# Patient Record
Sex: Female | Born: 1975 | Race: White | Hispanic: No | Marital: Married | Smoking: Current every day smoker
Health system: Southern US, Community
[De-identification: ages and names within clinical notes are randomized; demographics above are authoritative.]

## PROBLEM LIST (undated history)

## (undated) DIAGNOSIS — A0472 Enterocolitis due to Clostridium difficile, not specified as recurrent: Secondary | ICD-10-CM

## (undated) DIAGNOSIS — I10 Essential (primary) hypertension: Secondary | ICD-10-CM

## (undated) DIAGNOSIS — N83209 Unspecified ovarian cyst, unspecified side: Secondary | ICD-10-CM

## (undated) DIAGNOSIS — F32A Depression, unspecified: Secondary | ICD-10-CM

## (undated) DIAGNOSIS — F419 Anxiety disorder, unspecified: Secondary | ICD-10-CM

## (undated) DIAGNOSIS — F329 Major depressive disorder, single episode, unspecified: Secondary | ICD-10-CM

## (undated) DIAGNOSIS — N809 Endometriosis, unspecified: Secondary | ICD-10-CM

## (undated) HISTORY — PX: OVARIAN CYST REMOVAL: SHX89

---

## 1998-08-27 ENCOUNTER — Other Ambulatory Visit: Admission: RE | Admit: 1998-08-27 | Discharge: 1998-08-27 | Payer: Self-pay | Admitting: Obstetrics & Gynecology

## 1998-09-30 ENCOUNTER — Ambulatory Visit (HOSPITAL_COMMUNITY): Admission: RE | Admit: 1998-09-30 | Discharge: 1998-09-30 | Payer: Self-pay | Admitting: Obstetrics & Gynecology

## 1998-11-11 ENCOUNTER — Encounter: Payer: Self-pay | Admitting: Emergency Medicine

## 1998-11-11 ENCOUNTER — Emergency Department (HOSPITAL_COMMUNITY): Admission: EM | Admit: 1998-11-11 | Discharge: 1998-11-11 | Payer: Self-pay | Admitting: Emergency Medicine

## 2000-06-07 ENCOUNTER — Other Ambulatory Visit: Admission: RE | Admit: 2000-06-07 | Discharge: 2000-06-07 | Payer: Self-pay | Admitting: Obstetrics and Gynecology

## 2001-05-18 ENCOUNTER — Other Ambulatory Visit: Admission: RE | Admit: 2001-05-18 | Discharge: 2001-05-18 | Payer: Self-pay | Admitting: Obstetrics and Gynecology

## 2001-06-29 ENCOUNTER — Encounter: Payer: Self-pay | Admitting: Obstetrics and Gynecology

## 2001-06-29 ENCOUNTER — Ambulatory Visit (HOSPITAL_COMMUNITY): Admission: RE | Admit: 2001-06-29 | Discharge: 2001-06-29 | Payer: Self-pay | Admitting: Obstetrics and Gynecology

## 2001-11-22 ENCOUNTER — Inpatient Hospital Stay (HOSPITAL_COMMUNITY): Admission: AD | Admit: 2001-11-22 | Discharge: 2001-11-24 | Payer: Self-pay | Admitting: Obstetrics and Gynecology

## 2006-11-21 ENCOUNTER — Emergency Department (HOSPITAL_COMMUNITY): Admission: EM | Admit: 2006-11-21 | Discharge: 2006-11-21 | Payer: Self-pay | Admitting: Emergency Medicine

## 2007-10-28 ENCOUNTER — Inpatient Hospital Stay (HOSPITAL_COMMUNITY): Admission: AD | Admit: 2007-10-28 | Discharge: 2007-10-28 | Payer: Self-pay | Admitting: Obstetrics & Gynecology

## 2007-10-30 ENCOUNTER — Inpatient Hospital Stay (HOSPITAL_COMMUNITY): Admission: AD | Admit: 2007-10-30 | Discharge: 2007-10-30 | Payer: Self-pay | Admitting: Obstetrics and Gynecology

## 2007-11-06 ENCOUNTER — Encounter: Payer: Self-pay | Admitting: Obstetrics & Gynecology

## 2007-11-06 ENCOUNTER — Ambulatory Visit: Payer: Self-pay | Admitting: Obstetrics & Gynecology

## 2007-11-06 ENCOUNTER — Ambulatory Visit (HOSPITAL_COMMUNITY): Admission: AD | Admit: 2007-11-06 | Discharge: 2007-11-06 | Payer: Self-pay | Admitting: Obstetrics & Gynecology

## 2007-11-08 ENCOUNTER — Inpatient Hospital Stay (HOSPITAL_COMMUNITY): Admission: AD | Admit: 2007-11-08 | Discharge: 2007-11-08 | Payer: Self-pay | Admitting: Obstetrics & Gynecology

## 2007-11-10 ENCOUNTER — Inpatient Hospital Stay (HOSPITAL_COMMUNITY): Admission: AD | Admit: 2007-11-10 | Discharge: 2007-11-10 | Payer: Self-pay | Admitting: Obstetrics and Gynecology

## 2008-08-21 ENCOUNTER — Emergency Department (HOSPITAL_COMMUNITY): Admission: EM | Admit: 2008-08-21 | Discharge: 2008-08-21 | Payer: Self-pay | Admitting: Emergency Medicine

## 2009-09-18 ENCOUNTER — Emergency Department (HOSPITAL_COMMUNITY): Admission: EM | Admit: 2009-09-18 | Discharge: 2009-09-18 | Payer: Self-pay | Admitting: Emergency Medicine

## 2011-03-12 LAB — URINALYSIS, ROUTINE W REFLEX MICROSCOPIC
Glucose, UA: NEGATIVE mg/dL
Nitrite: NEGATIVE
Specific Gravity, Urine: 1.019 (ref 1.005–1.030)
pH: 6 (ref 5.0–8.0)

## 2011-03-12 LAB — URINE CULTURE: Colony Count: 40000

## 2011-03-12 LAB — URINE MICROSCOPIC-ADD ON

## 2011-03-24 ENCOUNTER — Emergency Department (HOSPITAL_COMMUNITY): Payer: Self-pay

## 2011-03-24 ENCOUNTER — Emergency Department (HOSPITAL_COMMUNITY)
Admission: EM | Admit: 2011-03-24 | Discharge: 2011-03-25 | Disposition: A | Discharge status: Other Acute Inpatient Hospital | Payer: Self-pay | Source: Home / Self Care | Attending: Emergency Medicine | Admitting: Emergency Medicine

## 2011-03-24 LAB — DIFFERENTIAL
Eosinophils Relative: 0 % (ref 0–5)
Lymphocytes Relative: 18 % (ref 12–46)
Lymphs Abs: 3.7 10*3/uL (ref 0.7–4.0)
Monocytes Relative: 7 % (ref 3–12)

## 2011-03-24 LAB — CBC
HCT: 40.9 % (ref 36.0–46.0)
MCH: 32.9 pg (ref 26.0–34.0)
MCV: 98.3 fL (ref 78.0–100.0)
RBC: 4.16 MIL/uL (ref 3.87–5.11)
RDW: 12.9 % (ref 11.5–15.5)
WBC: 20.4 10*3/uL — ABNORMAL HIGH (ref 4.0–10.5)

## 2011-03-24 LAB — COMPREHENSIVE METABOLIC PANEL
Alkaline Phosphatase: 71 U/L (ref 39–117)
BUN: 12 mg/dL (ref 6–23)
Chloride: 102 mEq/L (ref 96–112)
Creatinine, Ser: 0.7 mg/dL (ref 0.4–1.2)
Glucose, Bld: 111 mg/dL — ABNORMAL HIGH (ref 70–99)
Potassium: 3.6 mEq/L (ref 3.5–5.1)
Total Bilirubin: 0.6 mg/dL (ref 0.3–1.2)

## 2011-03-24 LAB — LIPASE, BLOOD: Lipase: 20 U/L (ref 11–59)

## 2011-03-24 LAB — POCT PREGNANCY, URINE: Preg Test, Ur: NEGATIVE

## 2011-03-24 LAB — URINALYSIS, ROUTINE W REFLEX MICROSCOPIC
Leukocytes, UA: NEGATIVE
Nitrite: NEGATIVE
Protein, ur: NEGATIVE mg/dL
Urobilinogen, UA: 1 mg/dL (ref 0.0–1.0)

## 2011-03-24 LAB — URINE MICROSCOPIC-ADD ON

## 2011-03-25 ENCOUNTER — Encounter (HOSPITAL_COMMUNITY): Payer: Self-pay

## 2011-03-25 ENCOUNTER — Emergency Department (HOSPITAL_COMMUNITY): Payer: Self-pay

## 2011-03-25 ENCOUNTER — Inpatient Hospital Stay (HOSPITAL_COMMUNITY)
Admission: AD | Admit: 2011-03-25 | Discharge: 2011-03-27 | DRG: 392 | Disposition: A | Discharge status: Home / Self Care | Payer: Self-pay | Source: Ambulatory Visit | Attending: Obstetrics & Gynecology | Admitting: Obstetrics & Gynecology

## 2011-03-25 DIAGNOSIS — R1900 Intra-abdominal and pelvic swelling, mass and lump, unspecified site: Principal | ICD-10-CM | POA: Diagnosis present

## 2011-03-25 DIAGNOSIS — R109 Unspecified abdominal pain: Secondary | ICD-10-CM | POA: Diagnosis present

## 2011-03-25 LAB — DIFFERENTIAL
Eosinophils Relative: 1 % (ref 0–5)
Lymphocytes Relative: 20 % (ref 12–46)
Lymphs Abs: 2.8 10*3/uL (ref 0.7–4.0)
Monocytes Absolute: 1.1 10*3/uL — ABNORMAL HIGH (ref 0.1–1.0)
Monocytes Relative: 8 % (ref 3–12)

## 2011-03-25 LAB — CBC
HCT: 38.3 % (ref 36.0–46.0)
MCV: 100.3 fL — ABNORMAL HIGH (ref 78.0–100.0)
RDW: 13.2 % (ref 11.5–15.5)
WBC: 13.9 10*3/uL — ABNORMAL HIGH (ref 4.0–10.5)

## 2011-03-25 LAB — CA 125: CA 125: 51.9 U/mL — ABNORMAL HIGH (ref 0.0–30.2)

## 2011-03-25 MED ORDER — IOHEXOL 300 MG/ML  SOLN
100.0000 mL | Freq: Once | INTRAMUSCULAR | Status: AC | PRN
  Administered 2011-03-25: 100 mL via INTRAVENOUS

## 2011-03-26 LAB — CBC
HCT: 37.1 % (ref 36.0–46.0)
MCV: 100.5 fL — ABNORMAL HIGH (ref 78.0–100.0)
RBC: 3.69 MIL/uL — ABNORMAL LOW (ref 3.87–5.11)
RDW: 13.1 % (ref 11.5–15.5)
WBC: 9.5 10*3/uL (ref 4.0–10.5)

## 2011-03-26 LAB — DIFFERENTIAL
Eosinophils Relative: 2 % (ref 0–5)
Lymphocytes Relative: 25 % (ref 12–46)
Lymphs Abs: 2.4 10*3/uL (ref 0.7–4.0)

## 2011-03-28 LAB — BETA HCG QUANT (REF LAB): Beta hCG, Tumor Marker: <0.5 m[IU]/mL (ref <5.0)

## 2011-03-30 ENCOUNTER — Inpatient Hospital Stay (HOSPITAL_COMMUNITY)
Admission: AD | Admit: 2011-03-30 | Discharge: 2011-03-30 | Disposition: A | Discharge status: Home / Self Care | Payer: Self-pay | Source: Ambulatory Visit | Attending: Obstetrics & Gynecology | Admitting: Obstetrics & Gynecology

## 2011-03-30 ENCOUNTER — Inpatient Hospital Stay (HOSPITAL_COMMUNITY): Payer: Self-pay

## 2011-03-30 DIAGNOSIS — R1031 Right lower quadrant pain: Secondary | ICD-10-CM

## 2011-03-30 DIAGNOSIS — N83209 Unspecified ovarian cyst, unspecified side: Secondary | ICD-10-CM

## 2011-03-30 LAB — DIFFERENTIAL
Eosinophils Relative: 1 % (ref 0–5)
Lymphocytes Relative: 24 % (ref 12–46)
Lymphs Abs: 2.6 10*3/uL (ref 0.7–4.0)
Neutro Abs: 6.9 10*3/uL (ref 1.7–7.7)
Neutrophils Relative %: 64 % (ref 43–77)

## 2011-03-30 LAB — URINALYSIS, ROUTINE W REFLEX MICROSCOPIC
Bilirubin Urine: NEGATIVE
Glucose, UA: NEGATIVE mg/dL
Hgb urine dipstick: NEGATIVE
Specific Gravity, Urine: 1.015 (ref 1.005–1.030)
pH: 6 (ref 5.0–8.0)

## 2011-03-30 LAB — CBC
Hemoglobin: 13.3 g/dL (ref 12.0–15.0)
MCH: 32.8 pg (ref 26.0–34.0)
MCHC: 33.3 g/dL (ref 30.0–36.0)
Platelets: 328 10*3/uL (ref 150–400)

## 2011-03-30 LAB — COMPREHENSIVE METABOLIC PANEL
AST: 25 U/L (ref 0–37)
CO2: 25 mEq/L (ref 19–32)
Calcium: 8.7 mg/dL (ref 8.4–10.5)
Creatinine, Ser: 0.6 mg/dL (ref 0.4–1.2)
GFR calc Af Amer: >60 mL/min (ref >60)
GFR calc non Af Amer: >60 mL/min (ref >60)

## 2011-03-30 LAB — URINE MICROSCOPIC-ADD ON

## 2011-03-30 MED ORDER — IOHEXOL 300 MG/ML  SOLN
100.0000 mL | Freq: Once | INTRAMUSCULAR | Status: AC | PRN
  Administered 2011-03-30: 100 mL via INTRAVENOUS

## 2011-04-01 NOTE — Discharge Summary (Signed)
  Katherine, [name]                ACCOUNT NO.:  [id number]  MEDICAL RECORD NO.:  [id number]           PATIENT TYPE:  I  LOCATION:  9311                          [facility]  PHYSICIAN:  Catalina Antigua, MD     DATE OF BIRTH:  1976/11/01  DATE OF ADMISSION:  03/25/2011 DATE OF DISCHARGE:  03/27/2011                              DISCHARGE SUMMARY   ADMISSION DIAGNOSIS:  Abdominal pain with pelvic mass.  DISCHARGE DIAGNOSIS:  Pelvic mass.  HOSPITAL COURSE:  This is a 35 year old G3, P1-0-2-1 who presented to [facility] for evaluation of abdominal pain.  Ultrasound and CT obtained at that time revealed a 9.6 x 7.1 x 8.2 cm solid-appearing mass to the uterus, which appears to extend to her right ovary with vascularity, small amount of free fluid was visualized in the pelvis. Mild inflammation at the base of the appendix.  The patient at that point reported a 3-day history of [name] pain.  The patient's care was transferred from Dr. Ewell Poe service to faculty service on March 25, 2011.  The patient's vital signs at that time were stable with a white count of admission of 20.4.  The patient was admitted for management of possible TOA versus ovarian fibroma versus uterine fibroids, no neoplasm.  Throughout her hospital course, the patient remained afebrile.  She was started on a course of cefotetan and doxycycline.  On hospital day #2, her white count was found to be 9.6.  The patient tolerated a regular diet and was able to ambulate and void and reported significant improvement in her abdominal pain.  CA-125 obtained was found to be 49.  On hospital day #2, the patient was deemed to be stable for discharge.  The patient was provided with a prescription of doxycycline 100 mg p.o. b.i.d. for 14 days and Flagyl 500 mg p.o. b.i.d. for 14 days.  The patient was instructed to return to the [facility] if she develops fevers or worsening abdominal pain.  The patient also instructed to follow  up in 2-3 weeks with GYN Clinic, specifically with Dr. Marice Potter for possible surgical intervention regarding this pelvic mass. The patient verbalized understanding, all questions were answered.     Catalina Antigua, MD     PC/MEDQ  D:  03/27/2011  T:  03/28/2011  Job:  [id number]  Electronically Signed by Catalina Antigua  on 04/01/2011 05:06:10 PM

## 2011-04-08 ENCOUNTER — Other Ambulatory Visit: Payer: Self-pay | Admitting: Obstetrics & Gynecology

## 2011-04-08 ENCOUNTER — Encounter (INDEPENDENT_AMBULATORY_CARE_PROVIDER_SITE_OTHER): Payer: Self-pay | Admitting: Obstetrics & Gynecology

## 2011-04-08 DIAGNOSIS — N83209 Unspecified ovarian cyst, unspecified side: Secondary | ICD-10-CM

## 2011-04-09 NOTE — Group Therapy Note (Signed)
[name], [name] NO.:  [id number]  MEDICAL RECORD NO.:  [id number]           PATIENT TYPE:  A  LOCATION:  [facility]                   FACILITY:  [facility]  PHYSICIAN:  Elsie Lincoln, MD      DATE OF BIRTH:  March 12, 1976  DATE OF SERVICE:  04/08/2011                                 CLINIC NOTE  The patient is a 35 year old female who presents for followup of a right adnexal mass and pain.  The patient was scanned with a CT scan and an ultrasound, which showed a 9.5 x 6.6 x 8.1 cm mass in the anterior right pelvic that appears to rise in the right ovary.  There is solid and cystic components and increased vascularity.  The patient had a smaller cyst about 2 cm size in 2008, but never had followup.  The patient is having pain and taking Dilaudid, this is keeping the pain better than the Percocet.  The patient denies any family history of ovarian or breast cancer.  PAST MEDICAL HISTORY:  No reported medical problem.  PAST SURGICAL HISTORY:  D and C.  SOCIAL HISTORY:  Has smoked in the past 12 months, occasional drinker, and no current drug abuse.  MEDICATIONS:  Doxycycline, Flagyl, hydrochlorothiazide, Dilaudid.  ALLERGIES:  None.  GYNECOLOGIC HISTORY: 1. Vaginal delivery. 2. Miscarriages. 3. No history of abnormal Pap smears. 4. Positive history of ovarian cyst as described above. 5. No fibroids. 6. No sexually transmitted diseases.  PHYSICAL EXAMINATION:  VITAL SIGNS:  Temperature 97, pulse 97.4, pulse 111, blood pressure 136/100, weight 139, height 64 inches. GENERAL:  Well nourished, well developed, in no apparent distress. HEENT:  Normocephalic, atraumatic.  Poor dentition. LUNGS:  Clear to auscultation bilaterally. HEART:  Regular rate and rhythm. BREASTS:  No masses, nontender.  No lymphadenopathy. ABDOMEN:  Soft.  No rebound or guarding. GENITALIA:  Tanner V.  Vagina pink.  Normal rugae.  Cervix closed, nontender.  Uterus anteverted, nontender.   Left adnexa nontender, normal size.  Right adnexa, large mass and painful on palpation. EXTREMITIES:  No edema.  ASSESSMENT AND PLAN: 63. A 35 year old female with right-sided ovarian/adnexal masses that     is worrisome for neoplasm.  We will refer to Gynecologic Oncology 2. CA-125 was drawn, which was slightly elevated, approximately 51. 3. Pap smear drawn. 4. Prescription for Dilaudid given. 5. The patient to come back here in 2-3 weeks after Gynecologic     Oncology appointment          ______________________________ Elsie Lincoln, MD    KL/MEDQ  D:  04/08/2011  T:  04/09/2011  Job:  [id number]

## 2011-04-21 NOTE — Op Note (Signed)
NAME:  MARC, SIVERTSEN NO.:  [id number]   MEDICAL RECORD NO.:  [id number]          PATIENT TYPE:  MAT   LOCATION:  MATC                          [facility]   PHYSICIAN:  Lesly Dukes, M.D. DATE OF BIRTH:  [birth year]   DATE OF PROCEDURE:  DATE OF DISCHARGE:                               OPERATIVE REPORT   PREOPERATIVE DIAGNOSIS:  A 35 year old female with inappropriate lapse  in betas, with a questionable missed abortion versus ectopic pregnancy  based on ultrasound findings.   POSTOPERATIVE DIAGNOSIS:  A 35 year old female with inappropriate lapse  in betas, with a questionable missed abortion versus ectopic pregnancy  based on ultrasound findings.   PROCEDURE:  Dilation evacuation with a frozen sectional pathology which  is not pending at the time of this dictation.   SURGEON:  Lesly Dukes, M.D.   ANESTHESIA:  MAC and local.   SPECIMENS:  Endometrial curettings to pathology for frozen section.   ESTIMATED BLOOD LOSS:  Minimal.   COMPLICATIONS:  None.   FINDINGS:  A slightly enlarged, anteverted uterus with no adnexal masses  on bimanual exam.  However, ultrasound shows a questionable gestational  sac versus pseudo sac in the endometrium, and a right adnexal mass that  could either be ectopic pregnancy or pedunculated bipartite.   PROCEDURE IN DETAIL:  After informed consent was obtained, the patient  was taken to the operating room, where MAC anesthesia was induced.  The  patient was placed in dorsal lithotomy position and prepared and draped  in the normal, sterile fashion.  The bladder was emptied.  A bivalve  speculum was placed into the vagina, and the anterior lip of the cervix  was grasped with a single-tooth tenaculum.  Twenty mL of 1% lidocaine  were injected at 3:00 and 9:00 for anesthesia.  The cervical os was  gently dilated to a #9 Hegar, and a #8 curved suction curet was gently  introduced into the uterus, and gentle suction  curettage was performed.  The suction curet was removed, and gentle, sharp curettage was  performed.  One final pass with the suction curet was performed  to  ensure that all  products of conception were removed.  At the end of the procedure, all  instruments were removed from the patient's vagina, and there was good  hemostasis from the cervix.  Again we were waiting on the results from  the pathologist to see if there was villi.  If there are no villi  present, the patient will proceed with methotrexate.      Lesly Dukes, M.D.  Electronically Signed     KHL/MEDQ  D:  11/06/2007  T:  11/06/2007  Job:  [id number]

## 2011-04-22 ENCOUNTER — Encounter: Payer: Self-pay | Admitting: Obstetrics & Gynecology

## 2011-04-22 ENCOUNTER — Ambulatory Visit: Payer: Self-pay | Attending: Gynecologic Oncology | Admitting: Gynecologic Oncology

## 2011-04-22 DIAGNOSIS — F17200 Nicotine dependence, unspecified, uncomplicated: Secondary | ICD-10-CM | POA: Insufficient documentation

## 2011-04-22 DIAGNOSIS — I10 Essential (primary) hypertension: Secondary | ICD-10-CM | POA: Insufficient documentation

## 2011-04-22 DIAGNOSIS — N839 Noninflammatory disorder of ovary, fallopian tube and broad ligament, unspecified: Secondary | ICD-10-CM | POA: Insufficient documentation

## 2011-04-22 DIAGNOSIS — Z801 Family history of malignant neoplasm of trachea, bronchus and lung: Secondary | ICD-10-CM | POA: Insufficient documentation

## 2011-04-23 NOTE — Consult Note (Signed)
NAMEKRISTLE, [name]                ACCOUNT NO.:  [id number]  MEDICAL RECORD NO.:  [id number]           PATIENT TYPE:  O  LOCATION:  GYN                          FACILITY:  [facility]  PHYSICIAN:  Avin Upperman A. Duard Brady, MD    DATE OF BIRTH:  12/24/1975  DATE OF CONSULTATION:  04/22/2011 DATE OF DISCHARGE:                                CONSULTATION   REFERRING PHYSICIAN:  Lesly Dukes, MD  The patient is seen today in consultation at the request of Dr. Penne Lash.  [name] is a 35 year old gravida 3, para 1 whose cycle is on today. She was in her usual state of health until April 14th when her cycle started, she began having increased cramping and discomfort.  On April 17th, she had increased bleeding, pain, and bloating and went to her local emergency room.  At that time, she had an ultrasound and CT scan done in the emergency room.  The CT revealed a 9.6 x 7.1 x 8.2 cm solid- appearing mass to the right of the uterus.  When compared to an ultrasound she had in December 2008, it appeared to be in a similar location and that she had a solid mass, but it is enlarging in comparison to the ultrasound at that time.  On CT scan, the mass measured as above, it was anterior on the right pelvis, appeared to be arising from the right ovary.  It was a solid appearance.  There was no colonic diverticulitis.  The term ileum was normal.  There was no evidence for appendicitis.  There was no lymphadenopathy reported or noted.  On ultrasound, she had uterus, measured 8.6 x 4.7 x 6 cm.  On the right ovary,  there was a 6.7 x 6.7 x 9.2 cm solid-appearing mass. The uterus extended to the right ovary and felt to be arise from the right ovary.  The solid mass was demonstrated in the similar location as stated above in December 2008, but is enlarged since that time.  Flow was demonstrated within the lesion on color Doppler.  Arterial velocity wave form was obtained.  Therefore, there was no torsion.  The  left ovary contained a simple-appearing cyst.  There was a moderate amount of free fluid.  The CA-125 was obtained that was elevated at 51.  At the time of her presentation, her white count was elevated to 20.  She was admitted for questionable TOA versus fibroma versus fibroid versus a neoplasm.  She remained afebrile, was started on cefotetan and doxycycline.  She quickly had a normal white blood count and on hospital day #2 was discharged with followup with [place].  She states since that time, she has been taking Dilaudid 2 mg about 3 times a day, her pain is about 2-3.  She has had increased pain this week as she has been on her menstrual cycle and started taking 3-4 Dilaudid per day.  She is using a cane to walk which helps with the pressure.  She denies any change in bowel or bladder habits, any nausea, vomiting, any early satiety, or dyspareunia.  Her cycles are regular, but  every 28 days.  Occasionally, she will have some bit lighter or heavier than the other.  MEDICATIONS:  Dilaudid and hydrochlorothiazide which she was to be taking, but ran out.  ALLERGIES:  None.  PAST SURGICAL HISTORY:  D and C.  She has a 40-year-old son via spontaneous vaginal delivery.  PAST MEDICAL HISTORY: 1. Hypertension. 2. Pelvic mass.  FAMILY HISTORY:  Her father had congestive heart failure and lung cancer.  He was a smoker, also had prostate melanoma.  SOCIAL HISTORY:  She smokes about a pack per day, she has done for 20 years.  She drinks 2 glasses of wine per day.  She is currently not working.  She denies any current drug use.  PHYSICAL EXAMINATION:  VITAL SIGNS:  Height 5 feet 4 inches, weight 145 pounds, blood pressure 150/100, pulse 80, respirations 16, temperature 97.8. GENERAL:  A well-nourished, well-developed female, in no acute distress. HEENT:  She has very poor dentition. NECK:  Supple.  There is no lymphadenopathy, no thyromegaly. LUNGS:  Clear to auscultation  bilaterally. CARDIOVASCULAR:  Regular rate and rhythm. ABDOMEN:  She has a tattoo in the right lower quadrant.  Abdomen is soft, it is tender to deep palpation.  No rebound.  No guarding.  There are no masses felt on abdominal exam.  Groins are negative for adenopathy. EXTREMITIES:  There is a large ecchymosis, measuring approximately 4-5 cm on her left anterior thigh. PELVIC:  External genitalia is within normal limits.  She is on her menstrual cycle.  Bimanual examination of the cervix is palpably normal. There is no cervical motion tenderness.  In the area of the right adnexa, there is an 8 cm mass that appears to be solid in the right lower quadrant that is easily mobile and ballottable, it is tender to deep palpation.  There are no masses or nodularity otherwise.  The left side is negative.  ASSESSMENT:  A 35 year old who based on imaging and presentation, I feel most likely had an ovarian torsion that resolved spontaneously.  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  That being said because of her pain and of course that there is no malignancy, I would recommend that we proceed with a right salpingo-oophorectomy.  I discussed with her that we could proceed with a laparoscopic approach and that would either allow [place] to perform the entire procedure laparoscopically versus at least assessing laparoscopically and then making decisions regarding a Pfannenstiel incision versus a vertical midline incision.  The surgery will be planned for next week, on May 22nd.  The patient knows that I will not be here that date, but Dr. Nelly Rout will be doing her surgery. We discussed that if the masses will be sent for frozen section, if it is benign that I will conclude the procedure and hopefully this can be done laparoscopically and she can go home.  She also understands that this reveals a malignant process.  She would  like to keep her uterus and her contralateral ovary and that she will need appropriate staging and this would require a laparotomy either via Pfannenstiel or vertical midline incision which she accepts.  Risks and benefits of the procedure were discussed with the patient and she wishes to proceed.  She will have the opportunity to meet Dr. Nelly Rout the morning of surgery. 1. She was given refills on her Dilaudid 2 mg #60 that should cover     her  for her perioperative period as well, particularly if this is     done via laparoscopy. 2. She was given a prescription for hydrochlorothiazide 25 mg daily to     take with refills for her hypertension. 3. She was encouraged to seek dental attention.  She states she is     working on her insurance so that that will be a feasibility.     Tanylah Schnoebelen A. Duard Brady, MD     PAG/MEDQ  D:  04/22/2011  T:  04/23/2011  Job:  [id number]  cc:   Lesly Dukes, M.D.  Telford Nab, R.N. [address] [place], [place] [address]  Electronically Signed by Cleda Mccreedy MD on 04/23/2011 03:13:10 PM

## 2011-04-24 ENCOUNTER — Other Ambulatory Visit: Payer: Self-pay | Admitting: Obstetrics & Gynecology

## 2011-04-24 ENCOUNTER — Other Ambulatory Visit: Payer: Self-pay | Admitting: Gynecologic Oncology

## 2011-04-24 ENCOUNTER — Encounter (HOSPITAL_COMMUNITY): Payer: Self-pay

## 2011-04-24 ENCOUNTER — Ambulatory Visit (HOSPITAL_COMMUNITY)
Admission: RE | Admit: 2011-04-24 | Discharge: 2011-04-24 | Disposition: A | Discharge status: Home / Self Care | Payer: Self-pay | Source: Ambulatory Visit | Attending: Obstetrics & Gynecology | Admitting: Obstetrics & Gynecology

## 2011-04-24 DIAGNOSIS — Z01810 Encounter for preprocedural cardiovascular examination: Secondary | ICD-10-CM | POA: Insufficient documentation

## 2011-04-24 DIAGNOSIS — Z01812 Encounter for preprocedural laboratory examination: Secondary | ICD-10-CM | POA: Insufficient documentation

## 2011-04-24 DIAGNOSIS — Z01818 Encounter for other preprocedural examination: Secondary | ICD-10-CM

## 2011-04-24 LAB — COMPREHENSIVE METABOLIC PANEL
ALT: 24 U/L (ref 0–35)
AST: 25 U/L (ref 0–37)
Alkaline Phosphatase: 81 U/L (ref 39–117)
CO2: 27 mEq/L (ref 19–32)
Chloride: 101 mEq/L (ref 96–112)
GFR calc non Af Amer: >60 mL/min (ref >60)
Glucose, Bld: 139 mg/dL — ABNORMAL HIGH (ref 70–99)
Potassium: 4.4 mEq/L (ref 3.5–5.1)
Sodium: 141 mEq/L (ref 135–145)
Total Bilirubin: 0.4 mg/dL (ref 0.3–1.2)

## 2011-04-24 LAB — DIFFERENTIAL
Eosinophils Relative: 0 % (ref 0–5)
Lymphocytes Relative: 14 % (ref 12–46)
Lymphs Abs: 2.1 10*3/uL (ref 0.7–4.0)
Neutro Abs: 12.4 10*3/uL — ABNORMAL HIGH (ref 1.7–7.7)

## 2011-04-24 LAB — CBC
HCT: 45.3 % (ref 36.0–46.0)
Hemoglobin: 15.6 g/dL — ABNORMAL HIGH (ref 12.0–15.0)
MCV: 95.2 fL (ref 78.0–100.0)
RBC: 4.76 MIL/uL (ref 3.87–5.11)
RDW: 12.8 % (ref 11.5–15.5)
WBC: 15.1 10*3/uL — ABNORMAL HIGH (ref 4.0–10.5)

## 2011-04-24 LAB — TYPE AND SCREEN: ABO/RH(D): A POS

## 2011-04-24 NOTE — Discharge Summary (Signed)
[facility]  Patient:    [name], [name] Visit Number: [id number] MRN: [id number]          Service Type: OBS Location: 910A [id number] [id number] Attending Physician:  Oliver Pila Dictated by:   Alvino Chapel, M.D. Admit Date:  11/22/2001 Discharge Date: 11/24/2001                             Discharge Summary  DISCHARGE DIAGNOSES:          1. Term pregnancy at 39 plus weeks.                               2. History of migraines.                               3. History of depression.                               4. Status post normal spontaneous vaginal                                  delivery.  DISCHARGE MEDICATIONS:        1. Percocet 1-2 tablets q.4h. p.r.n.                               2. Motrin 600 mg p.o. q.4h.  DISCHARGE FOLLOW-UP:          The patient is to follow up in six weeks for her routine postpartum examination.  HOSPITAL COURSE:              The patient is a 35 year old G1, P0 who is admitted at 39+ weeks with contractions every five minutes.  She had spontaneous rupture of membranes, of approximately two hours prior to admission.  The patient was uncomfortable at the time of admission.  Her pregnancy was complicated by history of anxiety and depression, controlled with Zoloft and p.r.n. Xanax.  Also the patient had a history of migraine headaches, however, those have been controlled throughout pregnancy.  PRENATAL LABS:                A+ blood type, antibody negative, RPR nonreactive, rubella immune.  Hepatitis B surface antigen negative.  HIV negative.  GC negative.  Chlamydia negative.  GBS negative.  PAST OBSTETRICAL HISTORY:     None.  PAST GYNECOLOGICAL HISTORY:   History of laser conization in 1997.  PAST SURGICAL HISTORY:        Conization only.  PAST MEDICAL HISTORY:         Depression and migraines.  MEDICATIONS:                  1. Zoloft.                               2. Xanax p.r.n.  HOSPITAL COURSE:               On admission patient is afebrile with stable vital signs.  Fetal heart rate was reassuring.  Cervix was completely effaced, 7 cm and 0  station.  She was grossly ruptured.  She received an epidural anesthesia and quickly reached complete dilation, pushed well.  She had normal spontaneous vaginal delivery of a vigorous female infant, over a first-degree perineal laceration.  Apgars were 8 and 9.  Weight 7 pounds 3 ounces.  The placenta was delivered spontaneously.  Vicryl 2-0 was used to repair the first-degree laceration for hemostasis.  There was also a left labial laceration, repaired for hemostasis with several interrupted sutures. Estimated blood loss was 400 cc.  Cervix and rectum were intact.  The patient was admitted for routine postpartum care and did well and on postpartum day #2 she was afebrile with stable vital signs and had normal lochia, and therefore, she was felt stable for discharge home. Dictated by:   Alvino Chapel, M.D. Attending Physician:  Oliver Pila DD:  12/12/01 TD:  12/12/01 Job: [id number]

## 2011-04-28 ENCOUNTER — Other Ambulatory Visit: Payer: Self-pay | Admitting: Gynecologic Oncology

## 2011-04-28 ENCOUNTER — Ambulatory Visit (HOSPITAL_COMMUNITY)
Admission: RE | Admit: 2011-04-28 | Discharge: 2011-04-28 | Disposition: A | Discharge status: Home / Self Care | Payer: Self-pay | Source: Ambulatory Visit | Attending: Obstetrics & Gynecology | Admitting: Obstetrics & Gynecology

## 2011-04-28 DIAGNOSIS — N80109 Endometriosis of ovary, unspecified side, unspecified depth: Secondary | ICD-10-CM | POA: Insufficient documentation

## 2011-04-28 DIAGNOSIS — I10 Essential (primary) hypertension: Secondary | ICD-10-CM | POA: Insufficient documentation

## 2011-04-28 DIAGNOSIS — N801 Endometriosis of ovary: Secondary | ICD-10-CM | POA: Insufficient documentation

## 2011-04-28 DIAGNOSIS — D279 Benign neoplasm of unspecified ovary: Secondary | ICD-10-CM | POA: Insufficient documentation

## 2011-04-28 DIAGNOSIS — Z79899 Other long term (current) drug therapy: Secondary | ICD-10-CM | POA: Insufficient documentation

## 2011-04-28 DIAGNOSIS — N8353 Torsion of ovary, ovarian pedicle and fallopian tube: Secondary | ICD-10-CM | POA: Insufficient documentation

## 2011-04-28 LAB — URINALYSIS, ROUTINE W REFLEX MICROSCOPIC
Glucose, UA: NEGATIVE mg/dL
Hgb urine dipstick: NEGATIVE
Specific Gravity, Urine: 1.02 (ref 1.005–1.030)

## 2011-04-28 LAB — DIFFERENTIAL
Basophils Relative: 1 % (ref 0–1)
Eosinophils Absolute: 0.1 10*3/uL (ref 0.0–0.7)
Eosinophils Relative: 1 % (ref 0–5)
Lymphs Abs: 2.6 10*3/uL (ref 0.7–4.0)
Monocytes Relative: 8 % (ref 3–12)
Neutrophils Relative %: 60 % (ref 43–77)

## 2011-04-28 LAB — CBC
MCH: 31.4 pg (ref 26.0–34.0)
MCV: 94.5 fL (ref 78.0–100.0)
Platelets: 260 10*3/uL (ref 150–400)
RBC: 4.33 MIL/uL (ref 3.87–5.11)
RDW: 12.7 % (ref 11.5–15.5)

## 2011-04-28 LAB — URINE MICROSCOPIC-ADD ON

## 2011-04-29 NOTE — Op Note (Signed)
NAMEHARRISON, [name] NO.:  [id number]  MEDICAL RECORD NO.:  [id number]           PATIENT TYPE:  O  LOCATION:  DAYL                         FACILITY:  [facility]  PHYSICIAN:  Laurette Schimke, MD     DATE OF BIRTH:  25-May-1976  DATE OF PROCEDURE:  04/28/2011 DATE OF DISCHARGE:  04/28/2011                              OPERATIVE REPORT   PREOPERATIVE DIAGNOSIS:  Right ovarian mass.  POSTOPERATIVE DIAGNOSES: 1. Torsion of right ovarian cystadenofibroma. 2. Endometriosis.  PROCEDURE:  Laparoscopic right ovarian cystectomy, ablation of endometrial implants, adhesiolysis.  SURGEON:  Laurette Schimke, MD  ASSISTANT:  Roseanna Rainbow, MD, and Telford Nab, RN  ANESTHESIA:  General endotracheal.  FINDINGS:  A 10 cm solid mass, arising from the right ovary which was adherent to anterior abdominal wall with evidence of torsion. Endometrial implants were noted on the anterior abdominal wall.  DESCRIPTION OF PROCEDURE:  The patient was taken to operating room, placed under general endotracheal anesthesia.  She was prepped and draped in usual sterile fashion.  Uterine manipulator was inserted and a 10 mm incision was made just superior to the umbilicus and a 10 Optiview placed in the abdomen.  The abdominal cavity entered under direct visualization.  The mass was adherent and pressure was maintained no higher than 15 mmHg.  Eight 5 mm ports were placed in the right and left lower quadrants and a 10 mm port was placed in the suprapubic area.  The umbilical port was 5 mm.  The abdomen was inspected and endometrial implants were noted in anterior abdominal wall.  The ovarian mass was evident, adherent to the anterior abdominal wall, but filmy endometriotic-appearing lesion.  The mass was pedunculated, arising from the right ovary, and torsion was noted.  The pelvic washings were obtained.  The ovary was separated from the ovarian mass, care taken to perform the  transection on the surface of the ovary.  Ovarian hemostasis was achieved with cautery.  The adhesions of the pelvic mass on anterior abdominal wall were dissected using electrocautery.  The suprapubic port was removed and a spleen bag placed within the abdominal cavity. Specimen was placed in the spleen bag and opening of the bag delivered through the surface.  The endometrial implants were ablated using cautery.  Hemostasis was assured.  Antibiotics were administered to the patient and the incision was opened to a total dimension of 3 cm.  The mass was morcellated and was noted to have cystic components.  Of note, this entire procedure occurred within the bag.  After removal of the entire specimen, the trocars were removed under direct visualization.  Hemostasis had been previously assured.  The suprapubic fascial incision was closed with 0 Vicryl suture.  All port sites were copiously irrigated and drained.  Hemostasis was achieved and skin was closed with running 4-0 subcuticular Vicryl and Steri-Strips were placed over the incision.  Frozen section returned with evidence of the right ovarian serous cystadenofibroma.  Sponge and needle count correct x3.  Drains, Foley draining clear urine was removed at the end of the case.  DISPOSITION:  The patient was  extubated and taken to recovery room in stable condition.  Of note, lidocaine was injected at all port sites prior to insertion of the port and upon completion of skin closure.     Laurette Schimke, MD     WB/MEDQ  D:  04/28/2011  T:  04/29/2011  Job:  [id number]  cc:   Roseanna Rainbow, M.D. Fax: [phone]  Telford Nab, R.N. [address] [place], [place] [address]  Elsie Lincoln, MD  Electronically Signed by Laurette Schimke MD on 04/29/2011 01:03:28 PM

## 2011-05-14 ENCOUNTER — Ambulatory Visit: Payer: Self-pay | Attending: Gynecologic Oncology | Admitting: Gynecologic Oncology

## 2011-05-14 DIAGNOSIS — Z09 Encounter for follow-up examination after completed treatment for conditions other than malignant neoplasm: Secondary | ICD-10-CM | POA: Insufficient documentation

## 2011-05-15 NOTE — Consult Note (Signed)
  NAMEARIONA, DESCHENE NO.:  [id number]  MEDICAL RECORD NO.:  [id number]  LOCATION:  GYN                          FACILITY:  [facility]  PHYSICIAN:  Laurette Schimke, MD     DATE OF BIRTH:  Apr 08, 1976  DATE OF CONSULTATION:  05/14/2011 DATE OF DISCHARGE:                                CONSULTATION   REASON FOR VISIT:  Postoperative check status post right ovarian cystectomy.  HISTORY OF PRESENT ILLNESS:  Ms. Obyrne is a 35 year old who initially presented with abdominal pain, elevated white blood count and a pelvic mass.  She was treated for presumed PID and her CA-125 was noted to be 20.  There was concern that this may be a possible tuboovarian abscess and after course of antibiotic she was reassessed and pelvic mass was still in place with persistent tenderness.  On Apr 28, 2011, she underwent diagnostic laparoscopy and findings at that time were notable for a large mass arising off the surface of the normal ovary.  The mass was adherent filmy adhesions to the anterior abdominal wall and there was evidence of torsion of the fallopian tube and  infundibulopelvic ligament and there was a right paraovarian cyst.  She underwent an ovarian cystectomy and the mass was consistent with fibroma measuring 8.3 cm with infarction.  The paratubal cyst was unremarkable.  She underwent ablation of the endometriotic appearing implants.  Ms. Zettel has done well and she states that the pain that she experienced before has gone away.  There is some residual discomfort.  PAST MEDICAL HISTORY:  No interval changes.  PAST SURGICAL HISTORY:  No interval changes.  REVIEW OF SYSTEMS:  No fever, chills, nausea, vomiting, diarrhea, constipation.  No abnormal bleeding.  Otherwise, 10-point review of systems negative.  PHYSICAL EXAMINATION:  VITAL SIGNS:  Weight 145 pounds, blood pressure 120/90, temperature 98.4, respiratory rate of 16, pulse of 62. ABDOMEN:  Soft, nontender.   Incision sites are well healed without any tenderness, discomfort guarding or erythema.  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  I have asked Ms. Kocian to follow up with Dr. Penne Lash.     Laurette Schimke, MD     WB/MEDQ  D:  05/14/2011  T:  05/14/2011  Job:  [id number]  cc:   Telford Nab, R.N. [address] [place], [place] [address]  Lesly Dukes, M.D.  Electronically Signed by Laurette Schimke MD on 05/15/2011 10:19:24 AM

## 2011-06-04 ENCOUNTER — Ambulatory Visit: Payer: Self-pay | Admitting: Obstetrics & Gynecology

## 2011-09-14 LAB — WET PREP, GENITAL: Yeast Wet Prep HPF POC: NONE SEEN

## 2011-09-14 LAB — CBC
HCT: 39.4
Platelets: 255
RDW: 12.7

## 2011-09-14 LAB — HCG, QUANTITATIVE, PREGNANCY: hCG, Beta Chain, Quant, S: 697 — ABNORMAL HIGH

## 2011-09-15 LAB — URINALYSIS, ROUTINE W REFLEX MICROSCOPIC
Nitrite: NEGATIVE
Protein, ur: NEGATIVE
Urobilinogen, UA: 0.2

## 2011-09-15 LAB — URINE MICROSCOPIC-ADD ON

## 2011-09-15 LAB — HCG, QUANTITATIVE, PREGNANCY: hCG, Beta Chain, Quant, S: 7035 — ABNORMAL HIGH

## 2011-09-15 LAB — WET PREP, GENITAL: Yeast Wet Prep HPF POC: NONE SEEN

## 2011-09-15 LAB — POCT PREGNANCY, URINE: Operator id: 280921

## 2011-09-15 LAB — CBC
HCT: 40.7
MCV: 96.5
RBC: 4.22
WBC: 7.3

## 2011-12-09 ENCOUNTER — Encounter (HOSPITAL_COMMUNITY): Payer: Self-pay | Admitting: *Deleted

## 2011-12-09 ENCOUNTER — Emergency Department (HOSPITAL_COMMUNITY): Payer: Self-pay

## 2011-12-09 ENCOUNTER — Emergency Department (HOSPITAL_COMMUNITY): Admission: EM | Admit: 2011-12-09 | Discharge: 2011-12-09 | Discharge status: Against Medical Advice | Payer: Self-pay

## 2011-12-09 ENCOUNTER — Emergency Department (HOSPITAL_COMMUNITY)
Admission: EM | Admit: 2011-12-09 | Discharge: 2011-12-09 | Disposition: A | Discharge status: Home / Self Care | Payer: Self-pay | Attending: Emergency Medicine | Admitting: Emergency Medicine

## 2011-12-09 DIAGNOSIS — R220 Localized swelling, mass and lump, head: Secondary | ICD-10-CM | POA: Insufficient documentation

## 2011-12-09 DIAGNOSIS — F17200 Nicotine dependence, unspecified, uncomplicated: Secondary | ICD-10-CM | POA: Insufficient documentation

## 2011-12-09 DIAGNOSIS — K089 Disorder of teeth and supporting structures, unspecified: Secondary | ICD-10-CM | POA: Insufficient documentation

## 2011-12-09 DIAGNOSIS — R6884 Jaw pain: Secondary | ICD-10-CM | POA: Insufficient documentation

## 2011-12-09 DIAGNOSIS — R221 Localized swelling, mass and lump, neck: Secondary | ICD-10-CM | POA: Insufficient documentation

## 2011-12-09 DIAGNOSIS — Z79899 Other long term (current) drug therapy: Secondary | ICD-10-CM | POA: Insufficient documentation

## 2011-12-09 DIAGNOSIS — K029 Dental caries, unspecified: Secondary | ICD-10-CM | POA: Insufficient documentation

## 2011-12-09 DIAGNOSIS — K047 Periapical abscess without sinus: Secondary | ICD-10-CM | POA: Insufficient documentation

## 2011-12-09 HISTORY — DX: Unspecified ovarian cyst, unspecified side: N83.209

## 2011-12-09 HISTORY — DX: Essential (primary) hypertension: I10

## 2011-12-09 LAB — DIFFERENTIAL
Basophils Absolute: 0 10*3/uL (ref 0.0–0.1)
Eosinophils Absolute: 0.1 10*3/uL (ref 0.0–0.7)
Lymphs Abs: 4 10*3/uL (ref 0.7–4.0)
Neutro Abs: 8.1 10*3/uL — ABNORMAL HIGH (ref 1.7–7.7)

## 2011-12-09 LAB — CBC
HCT: 47.6 % — ABNORMAL HIGH (ref 36.0–46.0)
MCHC: 35.3 g/dL (ref 30.0–36.0)
MCV: 94.8 fL (ref 78.0–100.0)
RDW: 13.1 % (ref 11.5–15.5)

## 2011-12-09 MED ORDER — PENICILLIN V POTASSIUM 500 MG PO TABS
500.0000 mg | ORAL_TABLET | Freq: Once | ORAL | Status: AC
  Administered 2011-12-09: 500 mg via ORAL
  Filled 2011-12-09: qty 1

## 2011-12-09 MED ORDER — OXYCODONE-ACETAMINOPHEN 5-325 MG PO TABS: 1.0000 | ORAL_TABLET | ORAL | Status: AC | PRN

## 2011-12-09 MED ORDER — PENICILLIN V POTASSIUM 500 MG PO TABS: 500.0000 mg | ORAL_TABLET | Freq: Four times a day (QID) | ORAL | Status: AC

## 2011-12-09 MED ORDER — OXYCODONE-ACETAMINOPHEN 5-325 MG PO TABS
1.0000 | ORAL_TABLET | Freq: Once | ORAL | Status: AC
  Administered 2011-12-09: 1 via ORAL
  Filled 2011-12-09: qty 1

## 2011-12-09 NOTE — ED Notes (Signed)
Pt reports no pain or swelling yesterday evening when she went to bed - this a.m. Pt awoke to moderate swelling, redness, and pain to rt jaw - pt denies fever.

## 2011-12-09 NOTE — ED Notes (Signed)
Right lower tooth pain and swelling.  Pain at 1030AM today and swelling upon awakening this AM.  Denies fevers. Pt has Dr. Weston Anna for a dentist in [place]. Pt reports the same tooth caused this issue about a year ago and was given antibiotics. Given Augmentin and swelling/ pain went away.  Similar symptoms today. 8/10 Pain.

## 2011-12-09 NOTE — ED Provider Notes (Signed)
History     CSN: [id number]  Arrival date & time 12/09/11  1737   First MD Initiated Contact with Patient 12/09/11 2106      Chief Complaint  Patient presents with  . Abscess    tooth RT lower.    (Consider location/radiation/quality/duration/timing/severity/associated sxs/prior treatment) Patient is a 36 y.o. female presenting with tooth pain. The history is provided by the patient.  Dental PainThe primary symptoms include mouth pain. Primary symptoms do not include dental injury, oral lesions, headaches, fever, shortness of breath or angioedema. The symptoms began 6 to 12 hours ago. The symptoms are unchanged. The symptoms are new. The symptoms occur constantly.  Additional symptoms include: jaw pain and facial swelling. Additional symptoms do not include: gum swelling, gum tenderness, purulent gums, trismus, trouble swallowing, pain with swallowing, excessive salivation, drooling and swollen glands. Medical issues include: smoking and periodontal disease.  Hx of dental caries/periodontal dz to R lower teeth. Noted swelling to R jawline this AM when she awoke. She sees Dr. Weston Anna, DDS in [place]; tried to call his office today but they were closed for the holiday. Denies fever, chills, trouble breathing or swallowing.  Past Medical History  Diagnosis Date  . Ovarian cyst   . Hypertension     Past Surgical History  Procedure Date  . Ovarian cyst removal     History reviewed. No pertinent family history.  History  Substance Use Topics  . Smoking status: Current Everyday Smoker -- 0.5 packs/day  . Smokeless tobacco: Not on file  . Alcohol Use: Yes     "Good week 2 bottles of wine, bad week 4 bottles of wine"    OB History    Grav Para Term Preterm Abortions TAB SAB Ect Mult Living                  Review of Systems  Constitutional: Negative for fever, chills, activity change and appetite change.  HENT: Positive for facial swelling and dental problem. Negative for  drooling, trouble swallowing, neck pain, neck stiffness and voice change.   Eyes: Negative for pain.  Respiratory: Negative for shortness of breath.   Cardiovascular: Negative for chest pain and palpitations.  Gastrointestinal: Negative for nausea and vomiting.  Skin: Positive for color change. Negative for rash and wound.  Neurological: Negative for dizziness, weakness and headaches.    Allergies  Review of patient's allergies indicates no known allergies.  Home Medications   Current Outpatient Rx  Name Route Sig Dispense Refill  . HYDROCHLOROTHIAZIDE 12.5 MG PO TABS Oral Take 12.5 mg by mouth daily.        BP 149/96  Pulse 89  Temp(Src) 98.3 F (36.8 C) (Oral)  Resp 16  Ht 5\' 4"  (1.626 m)  Wt 150 lb (68.04 kg)  BMI 25.75 kg/m2  SpO2 100%  LMP 11/24/2011  Physical Exam  Nursing note and vitals reviewed. Constitutional: She appears well-developed and well-nourished. No distress.  HENT:  Head: Normocephalic and atraumatic. No trismus in the jaw.  Mouth/Throat: Uvula is midline, oropharynx is clear and moist and mucous membranes are normal. No oral lesions. Abnormal dentition. Dental caries present. No dental abscesses.         Broken tooth as documented above; several teeth posteriorly have been extracted. No apparent area of fluctuance c/w abscess in mouth.  Mild to moderate redness, swelling extending along R jaw line. No definitive area of fluctuance. Area TTP.  Eyes: Conjunctivae and EOM are normal. Right eye exhibits no discharge.  Left eye exhibits no discharge.  Neck: Normal range of motion and full passive range of motion without pain. Neck supple.       No neck swelling  Cardiovascular: Normal rate, regular rhythm and normal heart sounds.   Pulmonary/Chest: Effort normal and breath sounds normal.  Lymphadenopathy:    She has no cervical adenopathy.  Neurological: She is alert.  Skin: Skin is warm and dry. She is not diaphoretic.  Psychiatric: She has a normal  mood and affect.    ED Course  Procedures (including critical care time)  Labs Reviewed  CBC - Abnormal; Notable for the following:    WBC 13.4 (*)    Hemoglobin 16.8 (*)    HCT 47.6 (*)    All other components within normal limits  DIFFERENTIAL - Abnormal; Notable for the following:    Neutro Abs 8.1 (*)    Monocytes Absolute 1.2 (*)    All other components within normal limits  LAB REPORT - SCANNED   Dg Orthopantogram  12/09/2011  *RADIOLOGY REPORT*  Clinical Data: Right jaw pain and swelling.  No known injury. Possible abscess.  DG ORTHOPANTOGRAM  Comparison: None.  Findings: Several teeth are absent.  There is right mandibular periodontal disease without gross bone destruction. The temporal mandibular joints are intact.  There is no evidence of acute fracture.  IMPRESSION: Right maxillary periodontal disease without well-defined periorbital abscess.  Original Report Authenticated By: Gerrianne Scale, M.D.     1. Dental abscess       MDM  Pt with swelling to R jaw and pain - suspect this is odontogenic in nature. She has no neck, maxillary or orbital pain; there is no cervical adenopathy. Mild elevated WBC. She was instructed to call her dentist's office back in AM to make an appt. Rx for pain meds and abx given. Return precautions discussed.        Grant Fontana, [place] 12/10/11 1536

## 2011-12-10 NOTE — ED Provider Notes (Signed)
Medical screening examination/treatment/procedure(s) were performed by non-physician practitioner and as supervising physician I was immediately available for consultation/collaboration.  Juliet Rude. Rubin Payor, MD 12/10/11 [phone]

## 2012-03-20 ENCOUNTER — Ambulatory Visit (INDEPENDENT_AMBULATORY_CARE_PROVIDER_SITE_OTHER): Payer: BC Managed Care – PPO | Admitting: Family Medicine

## 2012-03-20 VITALS — BP 150/107 | HR 91 | Temp 97.9°F | Resp 16 | Ht 64.0 in | Wt 160.0 lb

## 2012-03-20 DIAGNOSIS — I10 Essential (primary) hypertension: Secondary | ICD-10-CM

## 2012-03-20 DIAGNOSIS — K047 Periapical abscess without sinus: Secondary | ICD-10-CM

## 2012-03-20 MED ORDER — HYDROCHLOROTHIAZIDE 12.5 MG PO TABS: 12.5000 mg | ORAL_TABLET | Freq: Every day | ORAL | Status: DC

## 2012-03-20 MED ORDER — PENICILLIN V POTASSIUM 500 MG PO TABS: 500.0000 mg | ORAL_TABLET | Freq: Three times a day (TID) | ORAL | Status: AC

## 2012-03-20 MED ORDER — HYDROCODONE-ACETAMINOPHEN 5-500 MG PO TABS: 1.0000 | ORAL_TABLET | Freq: Three times a day (TID) | ORAL | Status: AC | PRN

## 2012-03-20 NOTE — Progress Notes (Signed)
  Subjective:    Patient ID: [name], female    DOB: 11-26-1976, 36 y.o.   MRN: [id number]  HPI 36 yo female with dental pain.  Treated at ED for dental abscess in early January.  Never made it to the dentist.  Plans to call dentist on Monday.  Just got insurance so will call one Monday morning.  This pain and swelling started this morning.  Pain to open mouth, chew, touch jaw.  Right lower jaw.    No fevers.  Slight twinge of pain yesterday.   History of hypertension - ran out of meds 2 months ago.  Would like refill if possible.    Review of Systems Negative except as per HPI     Objective:   Physical Exam  Constitutional: She appears well-developed and well-nourished.  Cardiovascular: Normal rate, regular rhythm, normal heart sounds and intact distal pulses.   No murmur heard. Pulmonary/Chest: Effort normal and breath sounds normal.  Neurological: She is alert.  Skin: Skin is warm and dry.   Right lower jaw - multiple extracted teeth, broken tooth.  Lone remaining tooth with redness and swelling and tenderness at gum line.  No flucturance.  Swelling over jaw.  No lymphadenopathy.        Assessment & Plan:  Dental abscess - Pen VK 500 TID for 10 days and vicodin 5 for pain.  Call dentist on Monday  HTN - refilled HCTZ.

## 2012-06-18 ENCOUNTER — Ambulatory Visit (INDEPENDENT_AMBULATORY_CARE_PROVIDER_SITE_OTHER): Payer: BC Managed Care – PPO | Admitting: Family Medicine

## 2012-06-18 ENCOUNTER — Encounter: Payer: Self-pay | Admitting: Family Medicine

## 2012-06-18 VITALS — BP 154/104 | HR 93 | Temp 98.3°F | Resp 16 | Ht 64.0 in | Wt 162.6 lb

## 2012-06-18 DIAGNOSIS — I10 Essential (primary) hypertension: Secondary | ICD-10-CM

## 2012-06-18 DIAGNOSIS — H00019 Hordeolum externum unspecified eye, unspecified eyelid: Secondary | ICD-10-CM

## 2012-06-18 DIAGNOSIS — Z91030 Bee allergy status: Secondary | ICD-10-CM

## 2012-06-18 DIAGNOSIS — Z91038 Other insect allergy status: Secondary | ICD-10-CM

## 2012-06-18 MED ORDER — EPINEPHRINE 0.3 MG/0.3ML IJ DEVI: 0.3000 mg | Freq: Once | INTRAMUSCULAR | Status: DC

## 2012-06-18 MED ORDER — OFLOXACIN 0.3 % OP SOLN: OPHTHALMIC | Status: DC

## 2012-06-18 MED ORDER — CEPHALEXIN 500 MG PO CAPS: 500.0000 mg | ORAL_CAPSULE | Freq: Three times a day (TID) | ORAL | Status: DC

## 2012-06-18 MED ORDER — LISINOPRIL-HYDROCHLOROTHIAZIDE 20-12.5 MG PO TABS: 1.0000 | ORAL_TABLET | Freq: Every day | ORAL | Status: DC

## 2012-06-18 NOTE — Patient Instructions (Signed)
Carry epipen or have it nearby  BP med one each morning  If eyelid gets worse despite medicines please return

## 2012-06-18 NOTE — Progress Notes (Signed)
Subjective: Patient is here today in fact did cystoscopy her left upper eyelid. It's been bothering her for about 3 weeks. She got a little crusting her I thought it was draining, but despite putting warm compresses to his she's not been able to get it to drain out. It's gotten more painful.  In addition to this she does have a history of high blood pressure. Has been on medication from this office, hydrochlorothiazide which takes her morning.  She has a history of a bee sting allergic reaction last year, with a large area of local redness which lasted for a long time. She has become more aware of this with fear of getting stung when she is out mowing by her self. She is interested in getting an EpiPen.  She smokes. She realizes she needs to quit, but has been under a lot of stress. There's been a lot of family disease problems, with her mother dying of Alzheimer's her father having heart disease. She helps care for her father.  Objective: No acute distress. 5 or 6 mm diameter cystic lesion on her left outer upper eyelid.  Assessment: hordoleum Hypertension Bee sting allergy Anxiety/stress Tobacco abuse  Plan: EpiPen Antibiotics and antibiotic eyedrops for the lesion on the lid EpiPen Increased blood pressure medicine to lisinopril HCT 20/12.5 one daily  Return in about 3 months to followup on her blood pressure

## 2012-06-28 ENCOUNTER — Encounter (HOSPITAL_COMMUNITY): Payer: Self-pay | Admitting: Obstetrics and Gynecology

## 2012-06-28 ENCOUNTER — Inpatient Hospital Stay (HOSPITAL_COMMUNITY): Payer: BC Managed Care – PPO

## 2012-06-28 ENCOUNTER — Inpatient Hospital Stay (HOSPITAL_COMMUNITY)
Admission: AD | Admit: 2012-06-28 | Discharge: 2012-06-28 | Disposition: A | Discharge status: Home / Self Care | Payer: BC Managed Care – PPO | Source: Ambulatory Visit | Attending: Obstetrics & Gynecology | Admitting: Obstetrics & Gynecology

## 2012-06-28 DIAGNOSIS — N949 Unspecified condition associated with female genital organs and menstrual cycle: Secondary | ICD-10-CM | POA: Insufficient documentation

## 2012-06-28 DIAGNOSIS — K5289 Other specified noninfective gastroenteritis and colitis: Secondary | ICD-10-CM

## 2012-06-28 DIAGNOSIS — R112 Nausea with vomiting, unspecified: Secondary | ICD-10-CM | POA: Insufficient documentation

## 2012-06-28 DIAGNOSIS — R109 Unspecified abdominal pain: Secondary | ICD-10-CM | POA: Insufficient documentation

## 2012-06-28 DIAGNOSIS — N809 Endometriosis, unspecified: Secondary | ICD-10-CM

## 2012-06-28 DIAGNOSIS — R197 Diarrhea, unspecified: Secondary | ICD-10-CM | POA: Insufficient documentation

## 2012-06-28 DIAGNOSIS — K529 Noninfective gastroenteritis and colitis, unspecified: Secondary | ICD-10-CM

## 2012-06-28 HISTORY — DX: Endometriosis, unspecified: N80.9

## 2012-06-28 LAB — URINE MICROSCOPIC-ADD ON

## 2012-06-28 LAB — CBC WITH DIFFERENTIAL/PLATELET
Basophils Relative: 1 % (ref 0–1)
Eosinophils Absolute: 0.1 10*3/uL (ref 0.0–0.7)
HCT: 45.1 % (ref 36.0–46.0)
Hemoglobin: 15.4 g/dL — ABNORMAL HIGH (ref 12.0–15.0)
Lymphs Abs: 3.7 10*3/uL (ref 0.7–4.0)
MCH: 32.5 pg (ref 26.0–34.0)
MCHC: 34.1 g/dL (ref 30.0–36.0)
MCV: 95.1 fL (ref 78.0–100.0)
Monocytes Absolute: 0.8 10*3/uL (ref 0.1–1.0)
Monocytes Relative: 8 % (ref 3–12)
Neutrophils Relative %: 52 % (ref 43–77)
RBC: 4.74 MIL/uL (ref 3.87–5.11)

## 2012-06-28 LAB — URINALYSIS, ROUTINE W REFLEX MICROSCOPIC
Bilirubin Urine: NEGATIVE
Ketones, ur: NEGATIVE mg/dL
Nitrite: NEGATIVE
Urobilinogen, UA: 0.2 mg/dL (ref 0.0–1.0)

## 2012-06-28 MED ORDER — HYDROCODONE-ACETAMINOPHEN 5-300 MG PO TABS: 1.0000 | ORAL_TABLET | ORAL | Status: DC | PRN

## 2012-06-28 NOTE — MAU Note (Signed)
abd pain for past 6-7 days.  Woke up this morning -pain was worse, so bad it made her nauseated. Just felt out of it.  Last year had really bad abd pain - was admitted and had a mass, dx with endometriosis.  On depo to help treat endometriosis.

## 2012-06-28 NOTE — MAU Provider Note (Signed)
History     CSN: [id number]  Arrival date and time: 06/28/12 1221   First Provider Initiated Contact with Patient 06/28/12 1320      Chief Complaint  Patient presents with  . Abdominal Pain   HPI This is a 36 y.o. female who presents with c/o pelvic pain. States has had Pain for 6-7 days.   Had a large ovarian fibroma removed recently and fears this may be another one.  Has also had Diarrhea, nausea and vomiting for the past few days.  Has had some spotting.   Has been on DepoProvera X 5 mos.   No fever   RN Note: abd pain for past 6-7 days. Woke up this morning -pain was worse, so bad it made her nauseated. Just felt out of it. Last year had really bad abd pain - was admitted and had a mass, dx with endometriosis. On depo to help treat endometriosis  OB History    Grav Para Term Preterm Abortions TAB SAB Ect Mult Living   2 1 1  0 1 0 1 0 0 1      Past Medical History  Diagnosis Date  . Ovarian cyst   . Hypertension   . Endometriosis     Past Surgical History  Procedure Date  . Ovarian cyst removal     History reviewed. No pertinent family history.  History  Substance Use Topics  . Smoking status: Current Everyday Smoker -- 0.5 packs/day  . Smokeless tobacco: Not on file  . Alcohol Use: Yes     "Good week 2 bottles of wine, bad week 4 bottles of wine"    Allergies: No Known Allergies  Prescriptions prior to admission  Medication Sig Dispense Refill  . cephALEXin (KEFLEX) 500 MG capsule Take 1 capsule (500 mg total) by mouth 3 (three) times daily.  21 capsule  0  . EPINEPHrine (EPIPEN) 0.3 mg/0.3 mL DEVI Inject 0.3 mLs (0.3 mg total) into the muscle once.  2 Device  2  . lisinopril-hydrochlorothiazide (PRINZIDE,ZESTORETIC) 20-12.5 MG per tablet Take 1 tablet by mouth daily.  30 tablet  5  . medroxyPROGESTERone (DEPO-PROVERA) 150 MG/ML injection Inject 150 mg into the muscle every 3 (three) months.      Marland Kitchen ofloxacin (OCUFLOX) 0.3 % ophthalmic solution Use 1-2  drops 4 times daily in eye  5 mL  0    ROS As listed in HPI  Physical Exam   Blood pressure 121/89, pulse 103, temperature 98.5 F (36.9 C), temperature source Oral, resp. rate 20, height 5' 2.5" (1.588 m), weight 157 lb (71.215 kg).  Physical Exam  Constitutional: She is oriented to person, place, and time. She appears well-developed and well-nourished. No distress.  Cardiovascular: Normal rate.   Respiratory: Effort normal.  GI: Soft. She exhibits no distension and no mass. There is tenderness (over lower abdomen). There is no rebound and no guarding.  Genitourinary:       Uterus and ovaries feel normal No masses or enlargement  Musculoskeletal: Normal range of motion.  Neurological: She is alert and oriented to person, place, and time.  Skin: Skin is warm and dry.  Psychiatric: She has a normal mood and affect.   Results for orders placed during the hospital encounter of 06/28/12 (from the past 24 hour(s))  URINALYSIS, ROUTINE W REFLEX MICROSCOPIC     Status: Abnormal   Collection Time   06/28/12 12:35 PM      Component Value Range   Color, Urine YELLOW  YELLOW  APPearance HAZY (*) CLEAR   Specific Gravity, Urine 1.025  1.005 - 1.030   pH 6.0  5.0 - 8.0   Glucose, UA NEGATIVE  NEGATIVE mg/dL   Hgb urine dipstick LARGE (*) NEGATIVE   Bilirubin Urine NEGATIVE  NEGATIVE   Ketones, ur NEGATIVE  NEGATIVE mg/dL   Protein, ur NEGATIVE  NEGATIVE mg/dL   Urobilinogen, UA 0.2  0.0 - 1.0 mg/dL   Nitrite NEGATIVE  NEGATIVE   Leukocytes, UA NEGATIVE  NEGATIVE  URINE MICROSCOPIC-ADD ON     Status: Abnormal   Collection Time   06/28/12 12:35 PM      Component Value Range   Squamous Epithelial / LPF FEW (*) RARE   RBC / HPF 0-2  <3 RBC/hpf   Urine-Other AMORPHOUS URATES/PHOSPHATES    CBC WITH DIFFERENTIAL     Status: Abnormal   Collection Time   06/28/12  1:45 PM      Component Value Range   WBC 9.6  4.0 - 10.5 K/uL   RBC 4.74  3.87 - 5.11 MIL/uL   Hemoglobin 15.4 (*) 12.0 -  15.0 g/dL   HCT 95.6  21.3 - 08.6 %   MCV 95.1  78.0 - 100.0 fL   MCH 32.5  26.0 - 34.0 pg   MCHC 34.1  30.0 - 36.0 g/dL   RDW 57.8  46.9 - 62.9 %   Platelets 259  150 - 400 K/uL   Neutrophils Relative 52  43 - 77 %   Neutro Abs 5.0  1.7 - 7.7 K/uL   Lymphocytes Relative 38  12 - 46 %   Lymphs Abs 3.7  0.7 - 4.0 K/uL   Monocytes Relative 8  3 - 12 %   Monocytes Absolute 0.8  0.1 - 1.0 K/uL   Eosinophils Relative 1  0 - 5 %   Eosinophils Absolute 0.1  0.0 - 0.7 K/uL   Basophils Relative 1  0 - 1 %   Basophils Absolute 0.1  0.0 - 0.1 K/uL   [place]:  Very small myoma left side of uterus         No ovarian cysts         No free fluid  MAU Course  Procedures  MDM CBC, Cultures (GC/Chl) on urine, [place]  Assessment and Plan  A:  Probable gastroenteritis      No evidence of pelvic pathology  P:  Discharge home      Reassured there is no ovarian tumor      Supportive care for Gastroenteritis. Declines nausea meds      Small Rx vicodin (#20) given per request.  Wynelle Bourgeois 06/28/2012, 1:20 PM

## 2012-06-29 NOTE — MAU Provider Note (Signed)
Attestation of Attending Supervision of Advanced Practitioner (CNM/NP): Evaluation and management procedures were performed by the Advanced Practitioner under my supervision and collaboration.  I have reviewed the Advanced Practitioner's note and chart, and I agree with the management and plan.  HARRAWAY-SMITH, Greggory Safranek 2:51 PM     

## 2012-12-03 ENCOUNTER — Ambulatory Visit (INDEPENDENT_AMBULATORY_CARE_PROVIDER_SITE_OTHER): Payer: BC Managed Care – PPO | Admitting: Family Medicine

## 2012-12-03 VITALS — BP 124/84 | HR 82 | Temp 98.6°F | Resp 16 | Ht 64.0 in | Wt 167.6 lb

## 2012-12-03 DIAGNOSIS — M549 Dorsalgia, unspecified: Secondary | ICD-10-CM

## 2012-12-03 DIAGNOSIS — M546 Pain in thoracic spine: Secondary | ICD-10-CM

## 2012-12-03 MED ORDER — OXAPROZIN 600 MG PO TABS: ORAL_TABLET | ORAL | Status: DC

## 2012-12-03 MED ORDER — HYDROCODONE-ACETAMINOPHEN 5-300 MG PO TABS: 1.0000 | ORAL_TABLET | ORAL | Status: DC | PRN

## 2012-12-03 MED ORDER — METHOCARBAMOL 750 MG PO TABS: ORAL_TABLET | ORAL | Status: DC

## 2012-12-03 NOTE — Patient Instructions (Addendum)
Alternate ice and heat on your back.  Take medications as directed.  If not improving dramatically over the next week or so return for a followup evaluation.

## 2012-12-03 NOTE — Progress Notes (Signed)
Subjective: About 4 AM she got up with severe pain in the right scapular area. It was terribly painful she moves right shoulder. She does not know of any injury. She did walk her little dog yesterday but does not think she got overly strained. She is not employed currently. Knows of no other injuries. Did not do anything else out of the ordinary. She has not had this problem in the past.  Objective: Moving her right shoulder causes her to have pain shoot down in the lower scapular region. Her abdomen was soft without masses or tenderness. No tenderness in the gallbladder region. She is tender right over the lower third of the scapula and medial to the scapula. Fair range of motion of her back.  Assessment: Right upper back strain/spasm of muscle surrounding scapula  Plan: Muscle relaxants and pain medication. If she's not improving she is to come back.

## 2013-02-27 ENCOUNTER — Ambulatory Visit: Payer: BC Managed Care – PPO

## 2013-02-27 ENCOUNTER — Ambulatory Visit (INDEPENDENT_AMBULATORY_CARE_PROVIDER_SITE_OTHER): Payer: BC Managed Care – PPO | Admitting: Emergency Medicine

## 2013-02-27 VITALS — BP 116/86 | HR 86 | Temp 98.2°F | Resp 16 | Ht 64.2 in | Wt 174.6 lb

## 2013-02-27 DIAGNOSIS — M549 Dorsalgia, unspecified: Secondary | ICD-10-CM

## 2013-02-27 MED ORDER — HYDROCODONE-ACETAMINOPHEN 5-300 MG PO TABS: 1.0000 | ORAL_TABLET | ORAL | Status: DC | PRN

## 2013-02-27 MED ORDER — METHOCARBAMOL 750 MG PO TABS: ORAL_TABLET | ORAL | Status: DC

## 2013-02-27 MED ORDER — MELOXICAM 15 MG PO TABS: 15.0000 mg | ORAL_TABLET | Freq: Every day | ORAL | Status: DC

## 2013-02-27 NOTE — Progress Notes (Signed)
  Subjective:    Patient ID: [name], female    DOB: 1976/06/13, 37 y.o.   MRN: [id number]  HPI Pt here c/o back pain in the middle of the back since Friday a  dull aching pain can not sleep because of the pain. denies any injury, no leg pain. Pt is taking ibuprofen for pain with no relieve, pt has had back pain since December. 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 Patient did home pregnancy test this weekend and it was negative     Review of Systems     Objective:   Physical Exam patient is alert and cooperative and in no distress. There is tenderness over the lower lumbar spine. Deep tendon reflexes are 2+ and symmetrical. Motor strength is 55 all muscle groups. She states she experiences pain when she gets to 80 or 90 of straight leg raising.  [facility] reading (PRIMARY) by  Dr. Cleta Alberts LS-spine films show no acute changes possible minimal narrowing L5-S1.   Results for orders placed in visit on 02/27/13  POCT URINE PREGNANCY      Result Value Range   Preg Test, Ur Negative         Assessment & Plan:  This appears to be musculoskeletal pain. We'll go ahead and check films of the lumbar spine since these have not been Done.Marland KitchenMarland Kitchen

## 2013-03-01 ENCOUNTER — Telehealth: Payer: Self-pay | Admitting: Radiology

## 2013-03-01 NOTE — Telephone Encounter (Signed)
Called [facility] and spoke to nurse. Patient unfortunately does not meet criteria for MRI scan at this time. She must undergo 4-6 weeks of treatment at doctors office prior to approval of the scan. You also did not note a neurologic impairment at office visit. Should you disagree with this decision, you can call for peer to peer before the end of the day on Thursday. 1 [phone] option 2 pt member number is [phone]

## 2013-06-03 ENCOUNTER — Ambulatory Visit (INDEPENDENT_AMBULATORY_CARE_PROVIDER_SITE_OTHER): Payer: BC Managed Care – PPO | Admitting: Family Medicine

## 2013-06-03 VITALS — BP 120/72 | HR 76 | Temp 98.0°F | Resp 18 | Ht 63.5 in | Wt 166.0 lb

## 2013-06-03 DIAGNOSIS — N9489 Other specified conditions associated with female genital organs and menstrual cycle: Secondary | ICD-10-CM

## 2013-06-03 DIAGNOSIS — Z3009 Encounter for other general counseling and advice on contraception: Secondary | ICD-10-CM

## 2013-06-03 LAB — POCT URINE PREGNANCY: Preg Test, Ur: NEGATIVE

## 2013-06-03 MED ORDER — MEDROXYPROGESTERONE ACETATE 150 MG/ML IM SUSP
150.0000 mg | Freq: Once | INTRAMUSCULAR | Status: AC
  Administered 2013-06-03: 150 mg via INTRAMUSCULAR

## 2013-06-03 NOTE — Progress Notes (Signed)
  Subjective:    Patient ID: [name], female    DOB: September 15, 1976, 37 y.o.   MRN: [id number]  HPI [name] is a 37 y.o. female  Here for Depo Shot.  Last had injection in August 2013. Initially started in May 2013 - no breakthrough bleeding on this.  No other contraception since then except condoms. No side effects except slight weight gain. Married x 14 years, no extramarital contact.  Current menses started 2 days ago. Normal timing. No recent unprotected intercourse - last unprotected intercourse 3-4 months ago.  Has not had menses since last May (did not have menses since on Depo). No abdominal pain except cramping with menses.   Plans on discussing tubal ligation or possible hysterectomy with GYN, plans on scheduling appt. Hx of fibroids - large one removed in 2012.    Quit smoking December of last year.   Review of Systems  Constitutional: Negative for chills.  Genitourinary: Positive for vaginal bleeding. Negative for vaginal discharge.       Objective:   Physical Exam  Constitutional: She is oriented to person, place, and time. She appears well-developed and well-nourished. No distress.  Pulmonary/Chest: Effort normal.  Abdominal: Soft. Bowel sounds are normal. There is no tenderness.  Neurological: She is alert and oriented to person, place, and time.  Skin: Skin is warm and dry.   Results for orders placed in visit on 06/03/13  POCT URINE PREGNANCY      Result Value Range   Preg Test, Ur Negative         Assessment & Plan:  [name] is a 37 y.o. female  Other general counseling and advice for contraceptive management - Plan: POCT urine pregnancy  Uterine cramping  Depo Provera given today, plans to call her OBGYN to discuss hysterectomy given prior fibroids and painful menses. Back up contraception discussed for next 1 week.   Patient Instructions  Depo Provera given today. Back up form of birth control for next 7 days. Follow up with your OBGYN  as discussed.  Your next DepoProvera would be due between 08/19/13 and 09/02/13.

## 2013-06-03 NOTE — Patient Instructions (Signed)
Depo Provera given today. Back up form of birth control for next 7 days. Follow up with your OBGYN as discussed.  Your next DepoProvera would be due between 08/19/13 and 09/02/13.

## 2013-08-04 ENCOUNTER — Ambulatory Visit (INDEPENDENT_AMBULATORY_CARE_PROVIDER_SITE_OTHER): Payer: BC Managed Care – PPO | Admitting: Internal Medicine

## 2013-08-04 VITALS — BP 124/104 | HR 113 | Temp 98.4°F | Resp 16 | Ht 64.0 in | Wt 163.6 lb

## 2013-08-04 DIAGNOSIS — G4700 Insomnia, unspecified: Secondary | ICD-10-CM

## 2013-08-04 DIAGNOSIS — R109 Unspecified abdominal pain: Secondary | ICD-10-CM

## 2013-08-04 DIAGNOSIS — R1031 Right lower quadrant pain: Secondary | ICD-10-CM

## 2013-08-04 DIAGNOSIS — N390 Urinary tract infection, site not specified: Secondary | ICD-10-CM

## 2013-08-04 DIAGNOSIS — F329 Major depressive disorder, single episode, unspecified: Secondary | ICD-10-CM

## 2013-08-04 LAB — POCT UA - MICROSCOPIC ONLY
Casts, Ur, LPF, POC: NEGATIVE
Crystals, Ur, HPF, POC: NEGATIVE
Mucus, UA: POSITIVE
Yeast, UA: NEGATIVE

## 2013-08-04 LAB — POCT URINALYSIS DIPSTICK
Bilirubin, UA: NEGATIVE
Glucose, UA: NEGATIVE
Ketones, UA: NEGATIVE
Nitrite, UA: NEGATIVE
Protein, UA: 30
Spec Grav, UA: >=1.03
Urobilinogen, UA: 0.2
pH, UA: 6

## 2013-08-04 LAB — POCT CBC
HCT, POC: 43 % (ref 37.7–47.9)
Hemoglobin: 13.7 g/dL (ref 12.2–16.2)
Lymph, poc: 3.4 (ref 0.6–3.4)
MCH, POC: 32.5 pg — AB (ref 27–31.2)
MCHC: 31.9 g/dL (ref 31.8–35.4)
POC LYMPH PERCENT: 34.2 %L (ref 10–50)
RDW, POC: 13.3 %
WBC: 9.8 10*3/uL (ref 4.6–10.2)

## 2013-08-04 MED ORDER — SERTRALINE HCL 50 MG PO TABS: 50.0000 mg | ORAL_TABLET | Freq: Every day | ORAL | Status: DC

## 2013-08-04 MED ORDER — CLONAZEPAM 1 MG PO TABS: 1.0000 mg | ORAL_TABLET | Freq: Two times a day (BID) | ORAL | Status: DC | PRN

## 2013-08-04 MED ORDER — CIPROFLOXACIN HCL 500 MG PO TABS: 500.0000 mg | ORAL_TABLET | Freq: Two times a day (BID) | ORAL | Status: DC

## 2013-08-04 NOTE — Progress Notes (Signed)
Subjective:    Patient ID: [name], female    DOB: [birth year], 37 y.o.   MRN: [id number]  HPI  37 y.o. Female presents with complaints of lower abdominal pain x 3 weeks. Has surgery 2 years ago to remove a cyst the size of a pineapple, on 04/28/2011. States that pain feels similar to that. Like a cramp, not constant. Pain comes out of no where. Denies nausea, vomiting, GI upset, diarrhea.  No blood in stool. Pain is right lower abdomen and she still has her appendix. She has no urinary pain, urgency, or frequency.  Patient also complains of problems with depression, has noticed this for the past several months. Having trouble getting out of bed. 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 Has had panic disorder in the past when mother was dx. With alzheimer. States that father has suffered from depression. Feels useless, has sleep pattern problems. Has seen psychiatrist in the past, was treated with paxil and klonapin. Is [name], and is looking into counselor thru church. Klonopin really helped her sleep last time she had depression.  Review of Systems     Objective:   Physical Exam  Vitals reviewed. Constitutional: She is oriented to person, place, and time. She appears well-developed and well-nourished. No distress.  Eyes: EOM are normal. No scleral icterus.  Cardiovascular: Normal rate.   Pulmonary/Chest: Effort normal and breath sounds normal.  Abdominal: Soft. Normal appearance. She exhibits no distension and no mass. Bowel sounds are decreased. There is no hepatosplenomegaly. There is tenderness in the right lower quadrant. There is tenderness at McBurney's point. There is no rigidity, no rebound, no guarding, no CVA tenderness and negative Murphy's sign. No hernia.    Tender, jumping does not cause pain  Neurological: She is alert and oriented to person, place, and time. She exhibits normal muscle tone. Coordination normal.   Results for orders placed in visit on 08/04/13  POCT UA -  MICROSCOPIC ONLY      Result Value Range   WBC, Ur, HPF, POC 3-8     RBC, urine, microscopic 1-3     Bacteria, U Microscopic 1+     Mucus, UA positive     Epithelial cells, urine per micros 10-15     Crystals, Ur, HPF, POC neg     Casts, Ur, LPF, POC neg     Yeast, UA neg    POCT URINALYSIS DIPSTICK      Result Value Range   Color, UA amber     Clarity, UA sl cloudy     Glucose, UA neg     Bilirubin, UA neg     Ketones, UA neg     Spec Grav, UA >=1.030     Blood, UA eng     pH, UA 6.0     Protein, UA 30     Urobilinogen, UA 0.2     Nitrite, UA neg     Leukocytes, UA small (1+)    POCT CBC      Result Value Range   WBC 9.8  4.6 - 10.2 K/uL   Lymph, poc 3.4  0.6 - 3.4   POC LYMPH PERCENT 34.2  10 - 50 %L   MID (cbc) 0.7  0 - 0.9   POC MID % 6.7  0 - 12 %M   POC Granulocyte 5.8  2 - 6.9   Granulocyte percent 59.1  37 - 80 %G   RBC 4.21  4.04 - 5.48 M/uL  Hemoglobin 13.7  12.2 - 16.2 g/dL   HCT, POC 47.8  29.5 - 47.9 %   MCV 102.1 (*) 80 - 97 fL   MCH, POC 32.5 (*) 27 - 31.2 pg   MCHC 31.9  31.8 - 35.4 g/dL   RDW, POC 62.1     Platelet Count, POC 281  142 - 424 K/uL   MPV 9.0  0 - 99.8 fL   CS urine       Assessment & Plan:  RLQ abdominal pain/Probable UTI If persists CT ro appendixin 48hrs Cipro 500mg  BID/Tylenol/Clear liquids  Depression requests counselor and medication again. Zoloft/Klonopin F/up 1 month

## 2013-08-04 NOTE — Patient Instructions (Addendum)
Depression, Adult Depression refers to feeling sad, low, down in the dumps, blue, gloomy, or empty. In general, there are two kinds of depression: 1. Depression that we all experience from time to time because of upsetting life experiences, including the loss of a job or the ending of a relationship (normal sadness or normal grief). This kind of depression is considered normal, is short lived, and resolves within a few days to 2 weeks. (Depression experienced after the loss of a loved one is called bereavement. Bereavement often lasts longer than 2 weeks but normally gets better with time.) 2. Clinical depression, which lasts longer than normal sadness or normal grief or interferes with your ability to function at home, at work, and in school. It also interferes with your personal relationships. It affects almost every aspect of your life. Clinical depression is an illness. Symptoms of depression also can be caused by conditions other than normal sadness and grief or clinical depression. Examples of these conditions are listed as follows:  Physical illness Some physical illnesses, including underactive thyroid gland (hypothyroidism), severe anemia, specific types of cancer, diabetes, uncontrolled seizures, heart and lung problems, strokes, and chronic pain are commonly associated with symptoms of depression.  Side effects of some prescription medicine In some people, certain types of prescription medicine can cause symptoms of depression.  Substance abuse Abuse of alcohol and illicit drugs can cause symptoms of depression. SYMPTOMS Symptoms of normal sadness and normal grief include the following:  Feeling sad or crying for short periods of time.  Not caring about anything (apathy).  Difficulty sleeping or sleeping too much.  No longer able to enjoy the things you used to enjoy.  Desire to be by oneself all the time (social isolation).  Lack of energy or motivation.  Difficulty  concentrating or remembering.  Change in appetite or weight.  Restlessness or agitation. Symptoms of clinical depression include the same symptoms of normal sadness or normal grief and also the following symptoms:  Feeling sad or crying all the time.  Feelings of guilt or worthlessness.  Feelings of hopelessness or helplessness.  Thoughts of suicide or the desire to harm yourself (suicidal ideation).  Loss of touch with reality (psychotic symptoms). Seeing or hearing things that are not real (hallucinations) or having false beliefs about your life or the people around you (delusions and paranoia). DIAGNOSIS  The diagnosis of clinical depression usually is based on the severity and duration of the symptoms. Your caregiver also will ask you questions about your medical history and substance use to find out if physical illness, use of prescription medicine, or substance abuse is causing your depression. Your caregiver also may order blood tests. TREATMENT  Typically, normal sadness and normal grief do not require treatment. However, sometimes antidepressant medicine is prescribed for bereavement to ease the depressive symptoms until they resolve. The treatment for clinical depression depends on the severity of your symptoms but typically includes antidepressant medicine, counseling with a mental health professional, or a combination of both. Your caregiver will help to determine what treatment is best for you. Depression caused by physical illness usually goes away with appropriate medical treatment of the illness. If prescription medicine is causing depression, talk with your caregiver about stopping the medicine, decreasing the dose, or substituting another medicine. Depression caused by abuse of alcohol or illicit drugs abuse goes away with abstinence from these substances. Some adults need professional help in order to stop drinking or using drugs. SEEK IMMEDIATE CARE IF:  You have   thoughts  about hurting yourself or others.  You lose touch with reality (have psychotic symptoms).  You are taking medicine for depression and have a serious side effect. FOR MORE INFORMATION National [employer] on Mental Illness: www.nami.[employer]: http://www.maynard.net/ Document Released: 11/20/2000 Document Revised: 05/24/2012 Document Reviewed: 02/22/2012 [facility] Patient Information 2014 [place], [place]. Urinary Tract Infection Urinary tract infections (UTIs) can develop anywhere along your urinary tract. Your urinary tract is your body's drainage system for removing wastes and extra water. Your urinary tract includes two kidneys, two ureters, a bladder, and a urethra. Your kidneys are a pair of bean-shaped organs. Each kidney is about the size of your fist. They are located below your ribs, one on each side of your spine. CAUSES Infections are caused by microbes, which are microscopic organisms, including fungi, viruses, and bacteria. These organisms are so small that they can only be seen through a microscope. Bacteria are the microbes that most commonly cause UTIs. SYMPTOMS  Symptoms of UTIs may vary by age and gender of the patient and by the location of the infection. Symptoms in young women typically include a frequent and intense urge to urinate and a painful, burning feeling in the bladder or urethra during urination. Older women and men are more likely to be tired, shaky, and weak and have muscle aches and abdominal pain. A fever may mean the infection is in your kidneys. Other symptoms of a kidney infection include pain in your back or sides below the ribs, nausea, and vomiting. DIAGNOSIS To diagnose a UTI, your caregiver will ask you about your symptoms. Your caregiver also will ask to provide a urine sample. The urine sample will be tested for bacteria and white blood cells. White blood cells are made by your body to help fight infection. TREATMENT  Typically, UTIs  can be treated with medication. Because most UTIs are caused by a bacterial infection, they usually can be treated with the use of antibiotics. The choice of antibiotic and length of treatment depend on your symptoms and the type of bacteria causing your infection. HOME CARE INSTRUCTIONS  If you were prescribed antibiotics, take them exactly as your caregiver instructs you. Finish the medication even if you feel better after you have only taken some of the medication.  Drink enough water and fluids to keep your urine clear or pale yellow.  Avoid caffeine, tea, and carbonated beverages. They tend to irritate your bladder.  Empty your bladder often. Avoid holding urine for long periods of time.  Empty your bladder before and after sexual intercourse.  After a bowel movement, women should cleanse from front to back. Use each tissue only once. SEEK MEDICAL CARE IF:   You have back pain.  You develop a fever.  Your symptoms do not begin to resolve within 3 days. SEEK IMMEDIATE MEDICAL CARE IF:   You have severe back pain or lower abdominal pain.  You develop chills.  You have nausea or vomiting.  You have continued burning or discomfort with urination. MAKE SURE YOU:   Understand these instructions.  Will watch your condition.  Will get help right away if you are not doing well or get worse. Document Released: 09/02/2005 Document Revised: 05/24/2012 Document Reviewed: 01/01/2012 [facility] Patient Information 2014 [place], [place]. Insomnia Insomnia is frequent trouble falling and/or staying asleep. Insomnia can be a long term problem or a short term problem. Both are common. Insomnia can be a short term problem when the wakefulness is related to  a certain stress or worry. Long term insomnia is often related to ongoing stress during waking hours and/or poor sleeping habits. Overtime, sleep deprivation itself can make the problem worse. Every little thing feels more severe because you  are overtired and your ability to cope is decreased. CAUSES   Stress, anxiety, and depression.  Poor sleeping habits.  Distractions such as TV in the bedroom.  Naps close to bedtime.  Engaging in emotionally charged conversations before bed.  Technical reading before sleep.  Alcohol and other sedatives. They may make the problem worse. They can hurt normal sleep patterns and normal dream activity.  Stimulants such as caffeine for several hours prior to bedtime.  Pain syndromes and shortness of breath can cause insomnia.  Exercise late at night.  Changing time zones may cause sleeping problems (jet lag). It is sometimes helpful to have someone observe your sleeping patterns. They should look for periods of not breathing during the night (sleep apnea). They should also look to see how long those periods last. If you live alone or observers are uncertain, you can also be observed at a sleep clinic where your sleep patterns will be professionally monitored. Sleep apnea requires a checkup and treatment. Give your caregivers your medical history. Give your caregivers observations your family has made about your sleep.  SYMPTOMS   Not feeling rested in the morning.  Anxiety and restlessness at bedtime.  Difficulty falling and staying asleep. TREATMENT   Your caregiver may prescribe treatment for an underlying medical disorders. Your caregiver can give advice or help if you are using alcohol or other drugs for self-medication. Treatment of underlying problems will usually eliminate insomnia problems.  Medications can be prescribed for short time use. They are generally not recommended for lengthy use.  Over-the-counter sleep medicines are not recommended for lengthy use. They can be habit forming.  You can promote easier sleeping by making lifestyle changes such as:  Using relaxation techniques that help with breathing and reduce muscle tension.  Exercising earlier in the  day.  Changing your diet and the time of your last meal. No night time snacks.  Establish a regular time to go to bed.  Counseling can help with stressful problems and worry.  Soothing music and white noise may be helpful if there are background noises you cannot remove.  Stop tedious detailed work at least one hour before bedtime. HOME CARE INSTRUCTIONS   Keep a diary. Inform your caregiver about your progress. This includes any medication side effects. See your caregiver regularly. Take note of:  Times when you are asleep.  Times when you are awake during the night.  The quality of your sleep.  How you feel the next day. This information will help your caregiver care for you.  Get out of bed if you are still awake after 15 minutes. Read or do some quiet activity. Keep the lights down. Wait until you feel sleepy and go back to bed.  Keep regular sleeping and waking hours. Avoid naps.  Exercise regularly.  Avoid distractions at bedtime. Distractions include watching television or engaging in any intense or detailed activity like attempting to balance the household checkbook.  Develop a bedtime ritual. Keep a familiar routine of bathing, brushing your teeth, climbing into bed at the same time each night, listening to soothing music. Routines increase the success of falling to sleep faster.  Use relaxation techniques. This can be using breathing and muscle tension release routines. It can also include visualizing peaceful  scenes. You can also help control troubling or intruding thoughts by keeping your mind occupied with boring or repetitive thoughts like the old concept of counting sheep. You can make it more creative like imagining planting one beautiful flower after another in your backyard garden.  During your day, work to eliminate stress. When this is not possible use some of the previous suggestions to help reduce the anxiety that accompanies stressful situations. MAKE SURE  YOU:   Understand these instructions.  Will watch your condition.  Will get help right away if you are not doing well or get worse. Document Released: 11/20/2000 Document Revised: 02/15/2012 Document Reviewed: 12/21/2007 [facility] Patient Information 2014 [place], [place].

## 2013-08-06 LAB — URINE CULTURE

## 2013-08-08 ENCOUNTER — Telehealth: Payer: Self-pay

## 2013-08-08 ENCOUNTER — Ambulatory Visit
Admission: RE | Admit: 2013-08-08 | Discharge: 2013-08-08 | Disposition: A | Discharge status: Home / Self Care | Payer: BC Managed Care – PPO | Source: Ambulatory Visit | Attending: Internal Medicine | Admitting: Internal Medicine

## 2013-08-08 ENCOUNTER — Ambulatory Visit (INDEPENDENT_AMBULATORY_CARE_PROVIDER_SITE_OTHER): Payer: BC Managed Care – PPO | Admitting: Internal Medicine

## 2013-08-08 ENCOUNTER — Telehealth: Payer: Self-pay | Admitting: Family Medicine

## 2013-08-08 VITALS — BP 124/80 | HR 99 | Temp 98.5°F | Resp 16 | Ht 64.0 in | Wt 161.0 lb

## 2013-08-08 DIAGNOSIS — R8281 Pyuria: Secondary | ICD-10-CM

## 2013-08-08 DIAGNOSIS — R82998 Other abnormal findings in urine: Secondary | ICD-10-CM

## 2013-08-08 DIAGNOSIS — M549 Dorsalgia, unspecified: Secondary | ICD-10-CM

## 2013-08-08 DIAGNOSIS — R109 Unspecified abdominal pain: Secondary | ICD-10-CM

## 2013-08-08 LAB — POCT URINALYSIS DIPSTICK
Blood, UA: NEGATIVE
Glucose, UA: NEGATIVE
Nitrite, UA: NEGATIVE
Spec Grav, UA: >=1.03
Urobilinogen, UA: 0.2
pH, UA: 5.5

## 2013-08-08 LAB — POCT UA - MICROSCOPIC ONLY
Amorphous, UA: POSITIVE
Casts, Ur, LPF, POC: NEGATIVE
Crystals, Ur, HPF, POC: NEGATIVE
Mucus, UA: POSITIVE
Yeast, UA: NEGATIVE

## 2013-08-08 LAB — POCT CBC
HCT, POC: 44.5 % (ref 37.7–47.9)
MCH, POC: 33.2 pg — AB (ref 27–31.2)
MCV: 102.6 fL — AB (ref 80–97)
MID (cbc): 0.6 (ref 0–0.9)
POC LYMPH PERCENT: 36.1 %L (ref 10–50)
Platelet Count, POC: 313 10*3/uL (ref 142–424)
RBC: 4.34 M/uL (ref 4.04–5.48)
RDW, POC: 14.2 %
WBC: 9.2 10*3/uL (ref 4.6–10.2)

## 2013-08-08 MED ORDER — IOHEXOL 300 MG/ML  SOLN
100.0000 mL | Freq: Once | INTRAMUSCULAR | Status: AC | PRN
  Administered 2013-08-08: 100 mL via INTRAVENOUS

## 2013-08-08 MED ORDER — MELOXICAM 15 MG PO TABS: 15.0000 mg | ORAL_TABLET | Freq: Every day | ORAL | Status: DC

## 2013-08-08 MED ORDER — IOHEXOL 300 MG/ML  SOLN
30.0000 mL | Freq: Once | INTRAMUSCULAR | Status: AC | PRN
  Administered 2013-08-08: 30 mL via ORAL

## 2013-08-08 NOTE — Telephone Encounter (Signed)
She takes an occasional advil

## 2013-08-08 NOTE — Telephone Encounter (Signed)
Patient was referred to [facility] today. Had an MRI done; patient wants to know if results have come back yet.  [phone]

## 2013-08-08 NOTE — Telephone Encounter (Signed)
Patient would like medication for pain until follow up Friday. Seen by Dr. Perrin Maltese today and had CT. Please advise

## 2013-08-08 NOTE — Telephone Encounter (Signed)
Patient notified and voiced understanding.

## 2013-08-08 NOTE — Telephone Encounter (Signed)
Left message for patient to return call.

## 2013-08-08 NOTE — Telephone Encounter (Signed)
Patient returned call. Notified and voiced understanding.  

## 2013-08-08 NOTE — Progress Notes (Signed)
  Subjective:    Patient ID: [name], female    DOB: [birth year], 37 y.o.   MRN: [id number]  HPI Abdominal better but persists Fhx of appendicitis   Review of Systems     Objective:   Physical Exam  Vitals reviewed. Constitutional: She is oriented to person, place, and time. She appears well-developed and well-nourished.  HENT:  Head: Normocephalic.  Eyes: EOM are normal. No scleral icterus.  Pulmonary/Chest: Effort normal.  Abdominal: Soft. Normal appearance. She exhibits no distension and no mass. Bowel sounds are decreased. There is tenderness in the right lower quadrant. There is rebound, guarding and tenderness at McBurney's point.    Neurological: She is alert and oriented to person, place, and time. She exhibits normal muscle tone. Coordination normal.  Psychiatric: She has a normal mood and affect.    Results for orders placed in visit on 08/08/13  POCT URINALYSIS DIPSTICK      Result Value Range   Color, UA amber     Clarity, UA cloudy     Glucose, UA neg     Bilirubin, UA small     Ketones, UA trace     Spec Grav, UA >=1.030     Blood, UA neg     pH, UA 5.5     Protein, UA trace     Urobilinogen, UA 0.2     Nitrite, UA neg     Leukocytes, UA small (1+)    POCT UA - MICROSCOPIC ONLY      Result Value Range   WBC, Ur, HPF, POC 8-12     RBC, urine, microscopic 0-3     Bacteria, U Microscopic 2+     Mucus, UA positive     Epithelial cells, urine per micros 10-12     Crystals, Ur, HPF, POC neg     Casts, Ur, LPF, POC neg     Yeast, UA neg     Amorphous, UA positive    POCT CBC      Result Value Range   WBC 9.2  4.6 - 10.2 K/uL   Lymph, poc 3.3  0.6 - 3.4   POC LYMPH PERCENT 36.1  10 - 50 %L   MID (cbc) 0.6  0 - 0.9   POC MID % 6.4  0 - 12 %M   POC Granulocyte 5.3  2 - 6.9   Granulocyte percent 57.5  37 - 80 %G   RBC 4.34  4.04 - 5.48 M/uL   Hemoglobin 14.4  12.2 - 16.2 g/dL   HCT, POC 08.6  57.8 - 47.9 %   MCV 102.6 (*) 80 - 97 fL   MCH, POC  33.2 (*) 27 - 31.2 pg   MCHC 32.4  31.8 - 35.4 g/dL   RDW, POC 46.9     Platelet Count, POC 313  142 - 424 K/uL   MPV 8.8  0 - 99.8 fL         Assessment & Plan:  CT abdomen stat ro appendicitis

## 2013-08-08 NOTE — Telephone Encounter (Signed)
Left message to return call. Per Dr. Perrin Maltese CT negative appendicitis, colon may have an infection. Return to clinic Friday for recheck.

## 2013-08-08 NOTE — Telephone Encounter (Signed)
Meds ordered this encounter  Medications  . meloxicam (MOBIC) 15 MG tablet    Sig: Take 1 tablet (15 mg total) by mouth daily.    Dispense:  30 tablet    Refill:  0    Order Specific Question:  Supervising Provider    Answer:  DOOLITTLE, ROBERT P [[id number]]   If this isn't helpful, let us know.

## 2013-08-08 NOTE — Patient Instructions (Signed)
Appendicitis °Appendicitis is when the appendix is swollen (inflamed). The inflammation can lead to developing a hole (perforation) and a collection of pus (abscess). °CAUSES  °There is not always an obvious cause of appendicitis. Sometimes it is caused by an obstruction in the appendix. The obstruction can be caused by: °· A small, hard, pea-sized ball of stool (fecalith). °· Enlarged lymph glands in the appendix. °SYMPTOMS  °· Pain around your belly button (navel) that moves toward your lower right belly (abdomen). The pain can become more severe and sharp as time passes. °· Tenderness in the lower right abdomen. Pain gets worse if you cough or make a sudden movement. °· Feeling sick to your stomach (nauseous). °· Throwing up (vomiting). °· Loss of appetite. °· Fever. °· Constipation. °· Diarrhea. °· Generally not feeling well. °DIAGNOSIS  °· Physical exam. °· Blood tests. °· Urine test. °· X-rays or a CT scan may confirm the diagnosis. °TREATMENT  °Once the diagnosis of appendicitis is made, the most common treatment is to remove the appendix as soon as possible. This procedure is called appendectomy. In an open appendectomy, a cut (incision) is made in the lower right abdomen and the appendix is removed. In a laparoscopic appendectomy, usually 3 small incisions are made. Long, thin instruments and a camera tube are used to remove the appendix. Most patients go home in 24 to 48 hours after appendectomy. °In some situations, the appendix may have already perforated and an abscess may have formed. The abscess may have a "wall" around it as seen on a CT scan. In this case, a drain may be placed into the abscess to remove fluid, and you may be treated with antibiotic medicines that kill germs. The medicine is given through a tube in your vein (IV). Once the abscess has resolved, it may or may not be necessary to have an appendectomy. You may need to stay in the hospital longer than 48 hours. °Document Released:  11/23/2005 Document Revised: 05/24/2012 Document Reviewed: 02/18/2010 °ExitCare® Patient Information ©2014 [employer]. ° °

## 2013-08-08 NOTE — Telephone Encounter (Signed)
What is she currently using for pain? ?Ibuprofen?  Meloxicam? Acetaminophen?

## 2013-08-11 ENCOUNTER — Ambulatory Visit (INDEPENDENT_AMBULATORY_CARE_PROVIDER_SITE_OTHER): Payer: BC Managed Care – PPO | Admitting: Internal Medicine

## 2013-08-11 VITALS — BP 124/90 | HR 95 | Temp 98.2°F | Resp 18 | Ht 63.0 in | Wt 160.0 lb

## 2013-08-11 DIAGNOSIS — K5289 Other specified noninfective gastroenteritis and colitis: Secondary | ICD-10-CM

## 2013-08-11 DIAGNOSIS — R109 Unspecified abdominal pain: Secondary | ICD-10-CM

## 2013-08-11 DIAGNOSIS — K529 Noninfective gastroenteritis and colitis, unspecified: Secondary | ICD-10-CM

## 2013-08-11 LAB — POCT CBC
Granulocyte percent: 49 %G (ref 37–80)
HCT, POC: 46.1 % (ref 37.7–47.9)
MCHC: 31.9 g/dL (ref 31.8–35.4)
MPV: 9.1 fL (ref 0–99.8)
POC Granulocyte: 3.9 (ref 2–6.9)
POC LYMPH PERCENT: 42.9 %L (ref 10–50)
POC MID %: 8.1 %M (ref 0–12)
RDW, POC: 14.1 %

## 2013-08-11 LAB — POCT URINALYSIS DIPSTICK
Bilirubin, UA: NEGATIVE
Nitrite, UA: NEGATIVE
Protein, UA: 30
Urobilinogen, UA: 0.2
pH, UA: 6

## 2013-08-11 LAB — POCT UA - MICROSCOPIC ONLY
Casts, Ur, LPF, POC: NEGATIVE
Crystals, Ur, HPF, POC: NEGATIVE
Yeast, UA: NEGATIVE

## 2013-08-11 LAB — COMPREHENSIVE METABOLIC PANEL
ALT: 25 U/L (ref 0–35)
AST: 27 U/L (ref 0–37)
Albumin: 4.8 g/dL (ref 3.5–5.2)
Alkaline Phosphatase: 57 U/L (ref 39–117)
BUN: 11 mg/dL (ref 6–23)
Calcium: 9.9 mg/dL (ref 8.4–10.5)
Chloride: 102 mEq/L (ref 96–112)
Potassium: 4.8 mEq/L (ref 3.5–5.3)
Sodium: 138 mEq/L (ref 135–145)
Total Protein: 8 g/dL (ref 6.0–8.3)

## 2013-08-11 LAB — POCT SEDIMENTATION RATE: POCT SED RATE: 18 mm/hr (ref 0–22)

## 2013-08-11 MED ORDER — CEFTRIAXONE SODIUM 1 G IJ SOLR
1.0000 g | Freq: Once | INTRAMUSCULAR | Status: AC
  Administered 2013-08-11: 1 g via INTRAMUSCULAR

## 2013-08-11 MED ORDER — HYDROCODONE-ACETAMINOPHEN 5-325 MG PO TABS: 1.0000 | ORAL_TABLET | Freq: Four times a day (QID) | ORAL | Status: DC | PRN

## 2013-08-11 NOTE — Progress Notes (Signed)
Subjective:    Patient ID: [name], female    DOB: November 01, 1976, 37 y.o.   MRN: [id number]  HPI Abdominal pain persists, no better and no worse, on 7th day of cipro, had pyuria no bladder sxs. Fhx of Crohns disease, she has signs of colitis on CT. Has diarrhea but no blood or mucus seen  Review of Systems     Objective:   Physical Exam  Vitals reviewed. Constitutional: She is oriented to person, place, and time. She appears well-developed and well-nourished. No distress.  Eyes: EOM are normal. No scleral icterus.  Cardiovascular: Normal rate.   Pulmonary/Chest: Effort normal and breath sounds normal.  Abdominal: Soft. Normal appearance and bowel sounds are normal. There is no hepatosplenomegaly. There is tenderness in the right lower quadrant, periumbilical area and left lower quadrant. There is no rigidity, no guarding and no CVA tenderness. No hernia. Hernia confirmed negative in the ventral area, confirmed negative in the right inguinal area and confirmed negative in the left inguinal area.    More tender on right, less on left  Musculoskeletal: Normal range of motion.  Neurological: She is alert and oriented to person, place, and time. She exhibits normal muscle tone. Coordination normal.  Skin: No rash noted.  Psychiatric: She has a normal mood and affect. Her behavior is normal. Thought content normal.    Results for orders placed in visit on 08/08/13  POCT URINALYSIS DIPSTICK      Result Value Range   Color, UA amber     Clarity, UA cloudy     Glucose, UA neg     Bilirubin, UA small     Ketones, UA trace     Spec Grav, UA >=1.030     Blood, UA neg     pH, UA 5.5     Protein, UA trace     Urobilinogen, UA 0.2     Nitrite, UA neg     Leukocytes, UA small (1+)    POCT UA - MICROSCOPIC ONLY      Result Value Range   WBC, Ur, HPF, POC 8-12     RBC, urine, microscopic 0-3     Bacteria, U Microscopic 2+     Mucus, UA positive     Epithelial cells, urine per  micros 10-12     Crystals, Ur, HPF, POC neg     Casts, Ur, LPF, POC neg     Yeast, UA neg     Amorphous, UA positive    POCT CBC      Result Value Range   WBC 9.2  4.6 - 10.2 K/uL   Lymph, poc 3.3  0.6 - 3.4   POC LYMPH PERCENT 36.1  10 - 50 %L   MID (cbc) 0.6  0 - 0.9   POC MID % 6.4  0 - 12 %M   POC Granulocyte 5.3  2 - 6.9   Granulocyte percent 57.5  37 - 80 %G   RBC 4.34  4.04 - 5.48 M/uL   Hemoglobin 14.4  12.2 - 16.2 g/dL   HCT, POC 40.9  81.1 - 47.9 %   MCV 102.6 (*) 80 - 97 fL   MCH, POC 33.2 (*) 27 - 31.2 pg   MCHC 32.4  31.8 - 35.4 g/dL   RDW, POC 91.4     Platelet Count, POC 313  142 - 424 K/uL   MPV 8.8  0 - 99.8 fL   Results for orders placed in visit on  08/11/13  POCT CBC      Result Value Range   WBC 8.0  4.6 - 10.2 K/uL   Lymph, poc 3.4  0.6 - 3.4   POC LYMPH PERCENT 42.9  10 - 50 %L   MID (cbc) 0.6  0 - 0.9   POC MID % 8.1  0 - 12 %M   POC Granulocyte 3.9  2 - 6.9   Granulocyte percent 49.0  37 - 80 %G   RBC 4.45  4.04 - 5.48 M/uL   Hemoglobin 14.7  12.2 - 16.2 g/dL   HCT, POC 19.1  47.8 - 47.9 %   MCV 103.5 (*) 80 - 97 fL   MCH, POC 33.0 (*) 27 - 31.2 pg   MCHC 31.9  31.8 - 35.4 g/dL   RDW, POC 29.5     Platelet Count, POC 313  142 - 424 K/uL   MPV 9.1  0 - 99.8 fL  POCT URINALYSIS DIPSTICK      Result Value Range   Color, UA yellow     Clarity, UA clear     Glucose, UA neg     Bilirubin, UA neg     Ketones, UA trace     Spec Grav, UA 1.025     Blood, UA moderate     pH, UA 6.0     Protein, UA 30     Urobilinogen, UA 0.2     Nitrite, UA neg     Leukocytes, UA small (1+)    POCT UA - MICROSCOPIC ONLY      Result Value Range   WBC, Ur, HPF, POC 8-12     RBC, urine, microscopic 2-5     Bacteria, U Microscopic 1+     Mucus, UA positive     Epithelial cells, urine per micros 10-15     Crystals, Ur, HPF, POC neg     Casts, Ur, LPF, POC neg     Yeast, UA neg           Assessment & Plan:  Some form of colitis/refer to GI/Pyuria  persists also Finish cipro/HC for pain

## 2013-08-11 NOTE — Patient Instructions (Signed)
Colitis °Colitis is inflammation of the colon. Colitis can be a short-term or long-standing (chronic) illness. Crohn's disease and ulcerative colitis are 2 types of colitis which are chronic. They usually require lifelong treatment. °CAUSES  °There are many different causes of colitis, including: °· Viruses. °· Germs (bacteria). °· Medicine reactions. °SYMPTOMS  °· Diarrhea. °· Intestinal bleeding. °· Pain. °· Fever. °· Throwing up (vomiting). °· Tiredness (fatigue). °· Weight loss. °· Bowel blockage. °DIAGNOSIS  °The diagnosis of colitis is based on examination and stool or blood tests. X-rays, CT scan, and colonoscopy may also be needed. °TREATMENT  °Treatment may include: °· Fluids given through the vein (intravenously). °· Bowel rest (nothing to eat or drink for a period of time). °· Medicine for pain and diarrhea. °· Medicines (antibiotics) that kill germs. °· Cortisone medicines. °· Surgery. °HOME CARE INSTRUCTIONS  °· Get plenty of rest. °· Drink enough water and fluids to keep your urine clear or pale yellow. °· Eat a well-balanced diet. °· Call your caregiver for follow-up as recommended. °SEEK IMMEDIATE MEDICAL CARE IF:  °· You develop chills. °· You have an oral temperature above 102° F (38.9° C), not controlled by medicine. °· You have extreme weakness, fainting, or dehydration. °· You have repeated vomiting. °· You develop severe belly (abdominal) pain or are passing bloody or tarry stools. °MAKE SURE YOU:  °· Understand these instructions. °· Will watch your condition. °· Will get help right away if you are not doing well or get worse. °Document Released: 12/31/2004 Document Revised: 02/15/2012 Document Reviewed: 03/28/2010 °[employer]® Patient Information ©2014 [employer]. ° °

## 2013-08-15 ENCOUNTER — Telehealth: Payer: Self-pay

## 2013-08-15 DIAGNOSIS — R109 Unspecified abdominal pain: Secondary | ICD-10-CM

## 2013-08-15 DIAGNOSIS — K529 Noninfective gastroenteritis and colitis, unspecified: Secondary | ICD-10-CM

## 2013-08-15 MED ORDER — HYDROCODONE-ACETAMINOPHEN 7.5-325 MG/15ML PO SOLN: 5.0000 mL | Freq: Four times a day (QID) | ORAL | Status: DC | PRN

## 2013-08-15 NOTE — Telephone Encounter (Signed)
OK, 4oz of lortab 1tsp tid

## 2013-08-15 NOTE — Telephone Encounter (Signed)
PT STATES SHE WAS GIVEN HYDROCODONE AND SINCE SHE HAVE TO GO TO A GI DR, WOULD LIKE TO KNOW IF SHE COULD HAVE IT IN A LIQUID FORM PLEASE CALL [phone]    [facility] ON [address]

## 2013-08-15 NOTE — Telephone Encounter (Signed)
Printed faxed

## 2013-08-16 ENCOUNTER — Encounter: Payer: Self-pay | Admitting: Gastroenterology

## 2013-08-17 ENCOUNTER — Encounter (HOSPITAL_COMMUNITY): Payer: Self-pay | Admitting: *Deleted

## 2013-08-17 ENCOUNTER — Inpatient Hospital Stay (HOSPITAL_COMMUNITY)
Admission: EM | Admit: 2013-08-17 | Discharge: 2013-08-19 | DRG: 183 | Disposition: A | Discharge status: Home / Self Care | Payer: BC Managed Care – PPO | Attending: Internal Medicine | Admitting: Internal Medicine

## 2013-08-17 ENCOUNTER — Emergency Department (HOSPITAL_COMMUNITY): Payer: BC Managed Care – PPO

## 2013-08-17 DIAGNOSIS — Z87891 Personal history of nicotine dependence: Secondary | ICD-10-CM

## 2013-08-17 DIAGNOSIS — Z79899 Other long term (current) drug therapy: Secondary | ICD-10-CM

## 2013-08-17 DIAGNOSIS — N809 Endometriosis, unspecified: Secondary | ICD-10-CM | POA: Diagnosis present

## 2013-08-17 DIAGNOSIS — I10 Essential (primary) hypertension: Secondary | ICD-10-CM | POA: Diagnosis present

## 2013-08-17 DIAGNOSIS — E860 Dehydration: Secondary | ICD-10-CM

## 2013-08-17 DIAGNOSIS — R197 Diarrhea, unspecified: Secondary | ICD-10-CM

## 2013-08-17 DIAGNOSIS — K5289 Other specified noninfective gastroenteritis and colitis: Secondary | ICD-10-CM

## 2013-08-17 DIAGNOSIS — A0472 Enterocolitis due to Clostridium difficile, not specified as recurrent: Principal | ICD-10-CM

## 2013-08-17 DIAGNOSIS — R112 Nausea with vomiting, unspecified: Secondary | ICD-10-CM

## 2013-08-17 DIAGNOSIS — R109 Unspecified abdominal pain: Secondary | ICD-10-CM

## 2013-08-17 DIAGNOSIS — K529 Noninfective gastroenteritis and colitis, unspecified: Secondary | ICD-10-CM

## 2013-08-17 DIAGNOSIS — K5100 Ulcerative (chronic) pancolitis without complications: Secondary | ICD-10-CM | POA: Diagnosis present

## 2013-08-17 LAB — CBC WITH DIFFERENTIAL/PLATELET
HCT: 43.2 % (ref 36.0–46.0)
Hemoglobin: 14.4 g/dL (ref 12.0–15.0)
Lymphocytes Relative: 29 % (ref 12–46)
Monocytes Absolute: 1.1 10*3/uL — ABNORMAL HIGH (ref 0.1–1.0)
Monocytes Relative: 9 % (ref 3–12)
Neutro Abs: 7 10*3/uL (ref 1.7–7.7)
Neutrophils Relative %: 60 % (ref 43–77)
RBC: 4.38 MIL/uL (ref 3.87–5.11)
WBC: 11.6 10*3/uL — ABNORMAL HIGH (ref 4.0–10.5)

## 2013-08-17 LAB — URINALYSIS, ROUTINE W REFLEX MICROSCOPIC
Glucose, UA: NEGATIVE mg/dL
Ketones, ur: NEGATIVE mg/dL
Protein, ur: NEGATIVE mg/dL
pH: 5.5 (ref 5.0–8.0)

## 2013-08-17 LAB — WET PREP, GENITAL
Clue Cells Wet Prep HPF POC: NONE SEEN
Trich, Wet Prep: NONE SEEN

## 2013-08-17 LAB — COMPREHENSIVE METABOLIC PANEL
AST: 30 U/L (ref 0–37)
Alkaline Phosphatase: 64 U/L (ref 39–117)
BUN: 10 mg/dL (ref 6–23)
CO2: 23 mEq/L (ref 19–32)
Chloride: 105 mEq/L (ref 96–112)
Creatinine, Ser: 0.71 mg/dL (ref 0.50–1.10)
GFR calc non Af Amer: >90 mL/min (ref >90)
Potassium: 4.8 mEq/L (ref 3.5–5.1)
Total Bilirubin: 0.4 mg/dL (ref 0.3–1.2)

## 2013-08-17 LAB — URINE MICROSCOPIC-ADD ON

## 2013-08-17 LAB — OCCULT BLOOD, POC DEVICE: Fecal Occult Bld: POSITIVE — AB

## 2013-08-17 MED ORDER — SODIUM CHLORIDE 0.9 % IV BOLUS (SEPSIS)
1000.0000 mL | Freq: Once | INTRAVENOUS | Status: AC
  Administered 2013-08-17: 1000 mL via INTRAVENOUS

## 2013-08-17 MED ORDER — POLYETHYLENE GLYCOL 3350 17 GM/SCOOP PO POWD
1.0000 | Freq: Once | ORAL | Status: AC
  Administered 2013-08-17: 1 via ORAL
  Filled 2013-08-17: qty 255

## 2013-08-17 MED ORDER — METRONIDAZOLE IN NACL 5-0.79 MG/ML-% IV SOLN
500.0000 mg | Freq: Once | INTRAVENOUS | Status: AC
  Administered 2013-08-17: 500 mg via INTRAVENOUS
  Filled 2013-08-17: qty 100

## 2013-08-17 MED ORDER — METRONIDAZOLE IN NACL 5-0.79 MG/ML-% IV SOLN
500.0000 mg | Freq: Three times a day (TID) | INTRAVENOUS | Status: DC
  Administered 2013-08-18 (×2): 500 mg via INTRAVENOUS
  Filled 2013-08-17 (×3): qty 100

## 2013-08-17 MED ORDER — CIPROFLOXACIN IN D5W 400 MG/200ML IV SOLN
400.0000 mg | Freq: Once | INTRAVENOUS | Status: AC
  Administered 2013-08-17: 400 mg via INTRAVENOUS
  Filled 2013-08-17: qty 200

## 2013-08-17 MED ORDER — CIPROFLOXACIN IN D5W 400 MG/200ML IV SOLN
400.0000 mg | Freq: Two times a day (BID) | INTRAVENOUS | Status: DC
  Administered 2013-08-18: 400 mg via INTRAVENOUS
  Filled 2013-08-17: qty 200

## 2013-08-17 MED ORDER — HYDROMORPHONE HCL PF 1 MG/ML IJ SOLN
1.0000 mg | INTRAMUSCULAR | Status: AC | PRN
  Administered 2013-08-17 – 2013-08-18 (×2): 1 mg via INTRAVENOUS
  Filled 2013-08-17 (×3): qty 1

## 2013-08-17 MED ORDER — ONDANSETRON HCL 4 MG/2ML IJ SOLN: 4.0000 mg | Freq: Three times a day (TID) | INTRAMUSCULAR | Status: AC | PRN

## 2013-08-17 MED ORDER — HYDROMORPHONE HCL PF 1 MG/ML IJ SOLN
1.0000 mg | Freq: Once | INTRAMUSCULAR | Status: AC
  Administered 2013-08-17: 1 mg via INTRAVENOUS
  Filled 2013-08-17: qty 1

## 2013-08-17 MED ORDER — IOHEXOL 300 MG/ML  SOLN
50.0000 mL | Freq: Once | INTRAMUSCULAR | Status: AC | PRN
  Administered 2013-08-17: 50 mL via ORAL

## 2013-08-17 MED ORDER — ONDANSETRON HCL 4 MG/2ML IJ SOLN
4.0000 mg | Freq: Once | INTRAMUSCULAR | Status: AC
  Administered 2013-08-17: 4 mg via INTRAVENOUS
  Filled 2013-08-17: qty 2

## 2013-08-17 MED ORDER — FENTANYL CITRATE 0.05 MG/ML IJ SOLN
50.0000 ug | Freq: Once | INTRAMUSCULAR | Status: AC
  Administered 2013-08-17: 50 ug via INTRAVENOUS
  Filled 2013-08-17: qty 2

## 2013-08-17 MED ORDER — CLONAZEPAM 0.5 MG PO TABS
0.5000 mg | ORAL_TABLET | Freq: Two times a day (BID) | ORAL | Status: DC | PRN
  Administered 2013-08-17 – 2013-08-18 (×2): 0.5 mg via ORAL
  Filled 2013-08-17 (×2): qty 1

## 2013-08-17 MED ORDER — SERTRALINE HCL 50 MG PO TABS
50.0000 mg | ORAL_TABLET | Freq: Every day | ORAL | Status: DC
  Administered 2013-08-17 – 2013-08-19 (×3): 50 mg via ORAL
  Filled 2013-08-17 (×3): qty 1

## 2013-08-17 MED ORDER — SODIUM CHLORIDE 0.9 % IV SOLN
INTRAVENOUS | Status: DC
  Administered 2013-08-17 – 2013-08-18 (×2): via INTRAVENOUS
  Administered 2013-08-19: 125 mL/h via INTRAVENOUS

## 2013-08-17 MED ORDER — FENTANYL CITRATE 0.05 MG/ML IJ SOLN
50.0000 ug | INTRAMUSCULAR | Status: DC | PRN
  Administered 2013-08-17: 50 ug via INTRAVENOUS
  Filled 2013-08-17: qty 2

## 2013-08-17 MED ORDER — IOHEXOL 300 MG/ML  SOLN
100.0000 mL | Freq: Once | INTRAMUSCULAR | Status: AC | PRN
  Administered 2013-08-17: 100 mL via INTRAVENOUS

## 2013-08-17 NOTE — ED Provider Notes (Signed)
Medical screening examination/treatment/procedure(s) were performed by non-physician practitioner and as supervising physician I was immediately available for consultation/collaboration.  Jiovanny Burdell R. Fallon Howerter, MD 08/17/13 [id number] 

## 2013-08-17 NOTE — H&P (Signed)
[facility] History and Physical  [name] XBJ:[id number] DOB: July 13, 1976 DOA: 08/17/2013  Referring physician: Dr. Rubin Payor PCP: No PCP Per Patient  Specialists: GI, Dr. Elnoria Howard  Chief Complaint: Abdominal pain and diarrhea  HPI: [name] is a 37 y.o. female has a past medical history significant for hypertension, endometriosis, prior history of ovarian cyst, presents to the ED with a chief complaint of lower abdominal pain progressively worsened for the past 3-4 weeks. Her pain is crampy in nature and is located in the right lower quadrant. She had a somewhat similar pain in the past, and at that time she was diagnosed with ovarian cysts. About a week ago she saw her primary care Dr. who ordered a CT of the abdomen pelvis which showed thickening of the sigmoid descending colon and rectum. She was referred to gastroenterology however she couldn't be seen but at the end of September. Patient's pain has progressively gotten worse in the last 2-3 days and she decided to come to the emergency room. She denies any fever or chills, denies any chest pain, shortness of breath, lightheadedness or dizziness. She denies any sick contacts. She denies any blood in the stool, however noticed bright red blood on the toilet paper. She has a family history of Crohn disease in her mother.  Review of Systems: As per history of present illness, otherwise negative  Past Medical History  Diagnosis Date  . Ovarian cyst   . Hypertension   . Endometriosis    Past Surgical History  Procedure Laterality Date  . Ovarian cyst removal     Social History:  reports that she quit smoking about 8 months ago. She does not have any smokeless tobacco history on file. She reports that she drinks about 2.4 ounces of alcohol per week. She reports that she does not use illicit drugs.  No Known Allergies  Family History  Problem Relation Age of Onset  . Alzheimer's disease Mother   . Heart disease Father   .  COPD Father    Prior to Admission medications   Medication Sig Start Date End Date Taking? Authorizing Provider  B Complex-C (B-COMPLEX WITH VITAMIN C) tablet Take 1 tablet by mouth daily.   Yes Historical Provider, MD  calcium carbonate (OS-CAL) 600 MG TABS tablet Take 600 mg by mouth daily with breakfast.   Yes Historical Provider, MD  clonazePAM (KLONOPIN) 1 MG tablet Take 1 tablet (1 mg total) by mouth 2 (two) times daily as needed for anxiety. 08/04/13  Yes Jonita Albee, MD  medroxyPROGESTERone (DEPO-PROVERA) 150 MG/ML injection Inject 150 mg into the muscle every 3 (three) months.   Yes Historical Provider, MD  Multiple Vitamin (MULTIVITAMIN WITH MINERALS) TABS tablet Take 1 tablet by mouth daily.   Yes Historical Provider, MD  sertraline (ZOLOFT) 50 MG tablet Take 1 tablet (50 mg total) by mouth daily. 08/04/13  Yes Jonita Albee, MD  ciprofloxacin (CIPRO) 500 MG tablet Take 1 tablet (500 mg total) by mouth 2 (two) times daily. 08/04/13   Jonita Albee, MD  EPINEPHrine (EPIPEN) 0.3 mg/0.3 mL DEVI Inject 0.3 mLs (0.3 mg total) into the muscle once. 06/18/12   Peyton Najjar, MD  HYDROcodone-acetaminophen (HYCET) 7.5-325 mg/15 ml solution Take 5 mLs by mouth 4 (four) times daily as needed for pain. 08/15/13 08/15/14  Jonita Albee, MD   Physical Exam: Filed Vitals:   08/17/13 1450 08/17/13 1837 08/17/13 1858  BP: 137/97 144/90 152/113  Pulse: 96  90  Temp: 98.4 F (36.9 C)    TempSrc: Oral    Resp: 16  20  SpO2: 97% 98% 98%     General:  No apparent distress  Eyes: PERRL, EOMI, no scleral icterus  ENT: moist oropharynx  Neck: supple, no JVD  Cardiovascular: regular rate without MRG; 2+ peripheral pulses  Respiratory: CTA biL, good air movement without wheezing, rhonchi or crackled  Abdomen: soft, tender to palpation RLQ, positive bowel sounds, no guarding, no rebound  Skin: no rashes  Musculoskeletal: no peripheral edema  Psychiatric: normal mood and affect  Neurologic:  non focal  Labs on Admission:  Basic Metabolic Panel:  Recent Labs Lab 08/11/13 0858 08/17/13 1513  NA 138 137  K 4.8 4.8  CL 102 105  CO2 26 23  GLUCOSE 83 88  BUN 11 10  CREATININE 0.86 0.71  CALCIUM 9.9 9.3   Liver Function Tests:  Recent Labs Lab 08/11/13 0858 08/17/13 1513  AST 27 30  ALT 25 24  ALKPHOS 57 64  BILITOT 0.6 0.4  PROT 8.0 7.6  ALBUMIN 4.8 4.1    Recent Labs Lab 08/11/13 0858 08/17/13 1513  LIPASE 13 18   CBC:  Recent Labs Lab 08/11/13 0908 08/17/13 1513  WBC 8.0 11.6*  NEUTROABS  --  7.0  HGB 14.7 14.4  HCT 46.1 43.2  MCV 103.5* 98.6  PLT  --  294   Radiological Exams on Admission: Ct Abdomen Pelvis W Contrast  08/17/2013   CLINICAL DATA:  Lower abdominal pain for past 3 weeks, at times greater on right, worse today, diarrhea, nausea, vomiting, past history hypertension, endometriosis  EXAM: CT ABDOMEN AND PELVIS WITH CONTRAST  TECHNIQUE: Multidetector CT imaging of the abdomen and pelvis was performed using the standard protocol following bolus administration of intravenous contrast. Sagittal and coronal MPR images reconstructed from axial data set.  CONTRAST:  50mL OMNIPAQUE IOHEXOL 300 MG/ML SOLN orally, OMNIPAQUE IOHEXOL 300 MG/ML SOLN  COMPARISON:  08/08/2013  FINDINGS: Lung bases clear.  Tiny nonspecific low-attenuation focus anteriorly in liver image 8.  Remainder of liver, spleen, pancreas, kidneys, and adrenal glands normal appearance.  Normal appendix, ovaries, bladder, and ureters.  Small enhancing nodule at left lateral margin of uterus question leiomyoma 2.5 x 2.3 cm image 64.  Diffuse thickening colonic wall from rectum to cecum compatible with pancolitis.  Minimal scattered pericolic edema.  Stomach and small bowel loops normal appearance.  No mass, adenopathy, free fluid, or free air.  No hernia or acute osseous findings.  IMPRESSION: Diffuse colonic wall thickening from cecum to rectum compatible with pancolitis.   Differential diagnosis includes ulcerative colitis, Crohn's disease, and infectious etiologies.  Ischemia is felt unlikely due to age and distribution.  Small probable uterine leiomyoma.   Electronically Signed   By: Ulyses Southward M.D.   On: 08/17/2013 18:06    EKG: Independently reviewed.  Assessment/Plan Principal Problem:   Nausea vomiting and diarrhea Active Problems:   Colitis  Pancolitis - gastroenterology has been consulted by the ED physician, and Dr. Elnoria Howard will see patient for probable colonoscopy. We'll continue ciprofloxacin and metronidazole for now, IV fluids for hydration. Pain control and supportive treatment. We'll prep the colon for colonoscopy. Appreciate gastroenterology input.   DVT prophylaxis - SCDs.  Code Status: Full  Family Communication: none  Disposition Plan: inpatient  Time spent: 61  Costin M. Elvera Lennox, MD [facility] Pager [phone]  If 7PM-7AM, please contact night-coverage [email or web address] Password [email or web address] 08/17/2013, 8:20 PM

## 2013-08-17 NOTE — ED Notes (Signed)
Medication delayed due to med not being in pyxis.

## 2013-08-17 NOTE — ED Notes (Signed)
Pelvic is set up 

## 2013-08-17 NOTE — Progress Notes (Signed)
Patient reports that she has seen a Dr. Perrin Maltese at [facility] but she does not consider him to be her pcp.  EDCM instructed patient to call the number on the back of her insurance card or go to insurance company website to help her find a pcp who is close to her and within network.  Patient verbalized understanding.

## 2013-08-17 NOTE — ED Notes (Signed)
Pt states past 3 weeks has had lower abdominal pain, at times more on R side, states has seen MD mult. Times and given antibiotic pill and shot form, states past 3 days has been worse, states having n/v/d.

## 2013-08-17 NOTE — ED Provider Notes (Signed)
CSN: [id number]     Arrival date & time 08/17/13  1411 History   First MD Initiated Contact with Patient 08/17/13 1514     Chief Complaint  Patient presents with  . Abdominal Pain  . Nausea  . Vomiting  . Diarrhea   (Consider location/radiation/quality/duration/timing/severity/associated sxs/prior Treatment) The history is provided by the patient and medical records. No language interpreter was used.    [name] is a 37 y.o. female  with a hx of ovarian cyst, HTN, endometriosis presents to the Emergency Department complaining of gradual, persistent, progressively worsening lower abdominal pain onset 4 weeks ago. Pt reports initially pain was a "tugging" sensation beginning 4 weeks ago.  Pt states pain intensified 3 days ago with associated N/V/D beginning at that time.  Abd pain is now stabbing/cramping in nature; waxing and waining and never resolving.  Pt reports emesis is NBNB, but with bright red blood on the toiler paper when wiping.  Pt with hx of hemorrhoids and thinks she may have one now as well.  Pt's pain is located in the RLQ, just above the pelvic bone.  Palpation makes the pain worse and nothing makes it better.  Pt was given hydrocodone at [facility], but it was not digesting and she was finding the pill in her diarrhea.   Pt reports she had a left ovarian mass removed on Apr 28, 2011.  Pt denies other abdominal surgeries.  Pt reports GI appointment is in Early October with [place] (pt would like to see Dr Laural Benes at [place]).  Pt's mother with a hx of chron's. Pt denies vaginal discharge, vaginal bleeding (unknown LMP 2/2 depo in June 2014), fever, chills, headache, neck pain, chest pain, shortness of breath, weakness, dizziness, syncope, dysuria, hematuria.     Past Medical History  Diagnosis Date  . Ovarian cyst   . Hypertension   . Endometriosis    Past Surgical History  Procedure Laterality Date  . Ovarian cyst removal     Family History  Problem Relation Age of Onset    . Alzheimer's disease Mother   . Heart disease Father   . COPD Father    History  Substance Use Topics  . Smoking status: Former Smoker -- 0.50 packs/day    Quit date: 11/30/2012  . Smokeless tobacco: Not on file  . Alcohol Use: 2.4 oz/week    4 Glasses of wine per week     Comment: "Good week 2 bottles of wine, bad week 4 bottles of wine"   OB History   Grav Para Term Preterm Abortions TAB SAB Ect Mult Living   2 1 1  0 1 0 1 0 0 1     Review of Systems  Constitutional: Negative for fever, diaphoresis, appetite change, fatigue and unexpected weight change.  HENT: Negative for mouth sores, trouble swallowing, neck pain and neck stiffness.   Respiratory: Negative for cough, chest tightness, shortness of breath, wheezing and stridor.   Cardiovascular: Negative for chest pain and palpitations.  Gastrointestinal: Positive for nausea, vomiting, abdominal pain and diarrhea. Negative for constipation, blood in stool, abdominal distention and rectal pain.  Genitourinary: Negative for dysuria, urgency, frequency, hematuria, flank pain and difficulty urinating.  Musculoskeletal: Negative for back pain.  Skin: Negative for rash.  Neurological: Negative for weakness.  Hematological: Negative for adenopathy.  Psychiatric/Behavioral: Negative for confusion.  All other systems reviewed and are negative.    Allergies  Review of patient's allergies indicates no known allergies.  Home Medications  Current Outpatient Rx  Name  Route  Sig  Dispense  Refill  . B Complex-C (B-COMPLEX WITH VITAMIN C) tablet   Oral   Take 1 tablet by mouth daily.         . calcium carbonate (OS-CAL) 600 MG TABS tablet   Oral   Take 600 mg by mouth daily with breakfast.         . clonazePAM (KLONOPIN) 1 MG tablet   Oral   Take 1 tablet (1 mg total) by mouth 2 (two) times daily as needed for anxiety.   30 tablet   3   . medroxyPROGESTERone (DEPO-PROVERA) 150 MG/ML injection   Intramuscular    Inject 150 mg into the muscle every 3 (three) months.         . Multiple Vitamin (MULTIVITAMIN WITH MINERALS) TABS tablet   Oral   Take 1 tablet by mouth daily.         . sertraline (ZOLOFT) 50 MG tablet   Oral   Take 1 tablet (50 mg total) by mouth daily.   30 tablet   3   . ciprofloxacin (CIPRO) 500 MG tablet   Oral   Take 1 tablet (500 mg total) by mouth 2 (two) times daily.   20 tablet   0   . EPINEPHrine (EPIPEN) 0.3 mg/0.3 mL DEVI   Intramuscular   Inject 0.3 mLs (0.3 mg total) into the muscle once.   2 Device   2   . HYDROcodone-acetaminophen (HYCET) 7.5-325 mg/15 ml solution   Oral   Take 5 mLs by mouth 4 (four) times daily as needed for pain.   120 mL   0    BP 152/113  Pulse 90  Temp(Src) 98.4 F (36.9 C) (Oral)  Resp 20  SpO2 98% Physical Exam  Nursing note and vitals reviewed. Constitutional: She appears well-developed and well-nourished. No distress.  HENT:  Head: Normocephalic and atraumatic.  Mouth/Throat: Oropharynx is clear and moist.  Eyes: Conjunctivae are normal. No scleral icterus.  Neck: Normal range of motion. Neck supple.  Cardiovascular: Normal rate, regular rhythm and intact distal pulses.   Pulmonary/Chest: Effort normal and breath sounds normal. No respiratory distress. She has no wheezes.  Abdominal: Soft. Normal appearance and bowel sounds are normal. She exhibits no distension and no mass. There is no hepatosplenomegaly. There is tenderness in the right lower quadrant and suprapubic area. There is guarding. There is no rebound and no CVA tenderness. Hernia confirmed negative in the right inguinal area and confirmed negative in the left inguinal area.    Genitourinary: Uterus normal. Pelvic exam was performed with patient prone. There is no rash, tenderness or lesion on the right labia. There is no rash, tenderness or lesion on the left labia. Uterus is not deviated, not enlarged, not fixed and not tender. Cervix exhibits no motion  tenderness, no discharge and no friability. Right adnexum displays tenderness (mild) and fullness. Right adnexum displays no mass. Left adnexum displays no mass, no tenderness and no fullness. No erythema, tenderness or bleeding around the vagina. No foreign body around the vagina. No signs of injury around the vagina. Vaginal discharge (thick, clear, scant) found.    Ectropion visualized at cervical os Nabothian cyst at the 8 o'clock position  Musculoskeletal: Normal range of motion. She exhibits no edema.  Lymphadenopathy:    She has no cervical adenopathy.       Right: No inguinal adenopathy present.       Left: No inguinal  adenopathy present.  Neurological: She is alert.  Speech is clear and goal oriented Moves extremities without ataxia  Skin: Skin is warm and dry. No rash noted. She is not diaphoretic.  Psychiatric: She has a normal mood and affect.    ED Course  Procedures (including critical care time) Labs Review Labs Reviewed  WET PREP, GENITAL - Abnormal; Notable for the following:    WBC, Wet Prep HPF POC FEW (*)    All other components within normal limits  CBC WITH DIFFERENTIAL - Abnormal; Notable for the following:    WBC 11.6 (*)    Monocytes Absolute 1.1 (*)    All other components within normal limits  URINALYSIS, ROUTINE W REFLEX MICROSCOPIC - Abnormal; Notable for the following:    Color, Urine AMBER (*)    APPearance TURBID (*)    Bilirubin Urine SMALL (*)    Leukocytes, UA MODERATE (*)    All other components within normal limits  URINE MICROSCOPIC-ADD ON - Abnormal; Notable for the following:    Squamous Epithelial / LPF MANY (*)    Bacteria, UA MANY (*)    All other components within normal limits  OCCULT BLOOD, POC DEVICE - Abnormal; Notable for the following:    Fecal Occult Bld POSITIVE (*)    All other components within normal limits  GC/CHLAMYDIA PROBE AMP  URINE CULTURE  COMPREHENSIVE METABOLIC PANEL  LIPASE, BLOOD  POCT PREGNANCY, URINE    Imaging Review Ct Abdomen Pelvis W Contrast  08/17/2013   CLINICAL DATA:  Lower abdominal pain for past 3 weeks, at times greater on right, worse today, diarrhea, nausea, vomiting, past history hypertension, endometriosis  EXAM: CT ABDOMEN AND PELVIS WITH CONTRAST  TECHNIQUE: Multidetector CT imaging of the abdomen and pelvis was performed using the standard protocol following bolus administration of intravenous contrast. Sagittal and coronal MPR images reconstructed from axial data set.  CONTRAST:  50mL OMNIPAQUE IOHEXOL 300 MG/ML SOLN orally, OMNIPAQUE IOHEXOL 300 MG/ML SOLN  COMPARISON:  08/08/2013  FINDINGS: Lung bases clear.  Tiny nonspecific low-attenuation focus anteriorly in liver image 8.  Remainder of liver, spleen, pancreas, kidneys, and adrenal glands normal appearance.  Normal appendix, ovaries, bladder, and ureters.  Small enhancing nodule at left lateral margin of uterus question leiomyoma 2.5 x 2.3 cm image 64.  Diffuse thickening colonic wall from rectum to cecum compatible with pancolitis.  Minimal scattered pericolic edema.  Stomach and small bowel loops normal appearance.  No mass, adenopathy, free fluid, or free air.  No hernia or acute osseous findings.  IMPRESSION: Diffuse colonic wall thickening from cecum to rectum compatible with pancolitis.  Differential diagnosis includes ulcerative colitis, Crohn's disease, and infectious etiologies.  Ischemia is felt unlikely due to age and distribution.  Small probable uterine leiomyoma.   Electronically Signed   By: Ulyses Southward M.D.   On: 08/17/2013 18:06    MDM   1. Pancolitis   2. Abdominal  pain, other specified site   3. Nausea vomiting and diarrhea      BENNA ARNO presents with abd pain for several weeks, worsening  3 days ago.  Record review shows the patient has been seen in urgent medical and family care several times for this. She had a CT scan on 08-2013 which showed mild wall thickening in the sigmoid colon,  descending colon, and  rectum. Differential diagnostic considerations include infectious colitis and inflammatory bowel disease, but no evidence of appendicitis the patient's pain is located largely in the  right lower quadrant.  Patient with family history of Crohn's disease.  She is here now with nausea vomiting and diarrhea. Concern for progressive colitis versus new diagnosis of inflammatory bowel disease. Will repeat CT scan.  CT with pancolitis and diffuse colonic wall thickening from cecum to rectum. Leukocytosis at 11.6 and possible urinary tract infection on UA, though the sample is contaminated.  Wet prep without concern for peritonitis, cervicitis, BV or vaginal infection. Pregnancy test negative. CMP unremarkable. Lipase within normal limits.  7:00PM Discussed with Dr. Elnoria Howard, GI on call. He recommends admission in preparation for colonoscopy tomorrow. He also recommends Flagyl and Cipro IV.  7:54 PM Patient to be admitted; alert, oriented nontoxic, nonseptic appearing. Her vital signs are stable here in the department. Pain is managed at this time.   Dierdre Forth, PA-C 08/17/13 1956

## 2013-08-17 NOTE — ED Notes (Signed)
Patient transported to CT 

## 2013-08-18 ENCOUNTER — Encounter (HOSPITAL_COMMUNITY)
Admission: EM | Disposition: A | Discharge status: Home / Self Care | Payer: Self-pay | Source: Home / Self Care | Attending: Internal Medicine

## 2013-08-18 DIAGNOSIS — E860 Dehydration: Secondary | ICD-10-CM

## 2013-08-18 DIAGNOSIS — K5100 Ulcerative (chronic) pancolitis without complications: Secondary | ICD-10-CM

## 2013-08-18 DIAGNOSIS — A0472 Enterocolitis due to Clostridium difficile, not specified as recurrent: Principal | ICD-10-CM

## 2013-08-18 LAB — URINE CULTURE

## 2013-08-18 LAB — CBC
HCT: 36.6 % (ref 36.0–46.0)
Hemoglobin: 12.3 g/dL (ref 12.0–15.0)
MCV: 98.4 fL (ref 78.0–100.0)
Platelets: 241 10*3/uL (ref 150–400)
RBC: 3.72 MIL/uL — ABNORMAL LOW (ref 3.87–5.11)
WBC: 8.8 10*3/uL (ref 4.0–10.5)

## 2013-08-18 LAB — BASIC METABOLIC PANEL
CO2: 20 mEq/L (ref 19–32)
Calcium: 8.3 mg/dL — ABNORMAL LOW (ref 8.4–10.5)
Chloride: 106 mEq/L (ref 96–112)
Potassium: 3.9 mEq/L (ref 3.5–5.1)
Sodium: 135 mEq/L (ref 135–145)

## 2013-08-18 LAB — GC/CHLAMYDIA PROBE AMP
CT Probe RNA: NEGATIVE
GC Probe RNA: NEGATIVE

## 2013-08-18 SURGERY — COLONOSCOPY
Anesthesia: Moderate Sedation

## 2013-08-18 MED ORDER — INFLUENZA VAC SPLIT QUAD 0.5 ML IM SUSP
0.5000 mL | INTRAMUSCULAR | Status: AC
  Administered 2013-08-19: 0.5 mL via INTRAMUSCULAR
  Filled 2013-08-18 (×2): qty 0.5

## 2013-08-18 MED ORDER — METRONIDAZOLE 500 MG PO TABS
500.0000 mg | ORAL_TABLET | Freq: Three times a day (TID) | ORAL | Status: DC
  Administered 2013-08-18 – 2013-08-19 (×2): 500 mg via ORAL
  Filled 2013-08-18 (×5): qty 1

## 2013-08-18 MED ORDER — PNEUMOCOCCAL VAC POLYVALENT 25 MCG/0.5ML IJ INJ
0.5000 mL | INJECTION | INTRAMUSCULAR | Status: AC
  Administered 2013-08-19: 0.5 mL via INTRAMUSCULAR
  Filled 2013-08-18 (×2): qty 0.5

## 2013-08-18 MED ORDER — MAGNESIUM SULFATE 40 MG/ML IJ SOLN
2.0000 g | Freq: Once | INTRAMUSCULAR | Status: AC
  Administered 2013-08-18: 2 g via INTRAVENOUS
  Filled 2013-08-18: qty 50

## 2013-08-18 MED ORDER — MORPHINE SULFATE 2 MG/ML IJ SOLN
2.0000 mg | INTRAMUSCULAR | Status: DC | PRN
  Administered 2013-08-18 – 2013-08-19 (×7): 2 mg via INTRAVENOUS
  Filled 2013-08-18 (×7): qty 1

## 2013-08-18 NOTE — Progress Notes (Signed)
[facility] PROGRESS NOTE  [name] AVW:[id number] DOB: 1976/08/14 DOA: 08/17/2013 PCP: No PCP Per Patient  Assessment/Plan: #1. C dIFF Colitis Patient still with lower abdominal pain. Patient drank prep for colonoscopy scheduled for this afternoon. Stool samples positive for c diff colitis. Patient on IV Flagyl. D/C cipro. Likely does not need colonoscopy now as cdiff is positive. GI consult pending. Once on a diet will transition to oral flagyl. IVF. Supportive care.  #2 Dehydration IVF  #3 N/V/D/abd pain Secondary to # 1. Follow.  #4 Prophylaxis SCD for DVT prophylaxis   Code Status: Full Family Communication: Updated patient, no family present. Disposition Plan: Home when medically stable   Consultants:  GI: Dr Elnoria Howard pending  Procedures:  None  Antibiotics:  IV Cipro 08/17/13 --> 08/18/13  IV Flagyl 08/17/13  HPI/Subjective: Patient still with lower abdominal pain, diarrhea. No emesis.  Objective: Filed Vitals:   08/18/13 0639  BP: 125/87  Pulse: 92  Temp: 98.8 F (37.1 C)  Resp: 18    Intake/Output Summary (Last 24 hours) at 08/18/13 1036 Last data filed at 08/18/13 0835  Gross per 24 hour  Intake 1737.5 ml  Output      0 ml  Net 1737.5 ml   Filed Weights   08/17/13 2047  Weight: 75.978 kg (167 lb 8 oz)    Exam:   General:  NAD  Cardiovascular: RRR  Respiratory: CTAB  Abdomen: Soft/NT/ND/+BS/TTP in lower abd and left lower quad  Musculoskeletal: 5/5 BUE strength, 5/5 BLE strength  Data Reviewed: Basic Metabolic Panel:  Recent Labs Lab 08/17/13 1513 08/18/13 0820  NA 137 135  K 4.8 3.9  CL 105 106  CO2 23 20  GLUCOSE 88 101*  BUN 10 5*  CREATININE 0.71 0.65  CALCIUM 9.3 8.3*   Liver Function Tests:  Recent Labs Lab 08/17/13 1513  AST 30  ALT 24  ALKPHOS 64  BILITOT 0.4  PROT 7.6  ALBUMIN 4.1    Recent Labs Lab 08/17/13 1513  LIPASE 18   No results found for this basename: AMMONIA,  in the last 168  hours CBC:  Recent Labs Lab 08/17/13 1513 08/18/13 0820  WBC 11.6* 8.8  NEUTROABS 7.0  --   HGB 14.4 12.3  HCT 43.2 36.6  MCV 98.6 98.4  PLT 294 241   Cardiac Enzymes: No results found for this basename: CKTOTAL, CKMB, CKMBINDEX, TROPONINI,  in the last 168 hours BNP (last 3 results) No results found for this basename: PROBNP,  in the last 8760 hours CBG: No results found for this basename: GLUCAP,  in the last 168 hours  Recent Results (from the past 240 hour(s))  WET PREP, GENITAL     Status: Abnormal   Collection Time    08/17/13  5:05 PM      Result Value Range Status   Yeast Wet Prep HPF POC NONE SEEN  NONE SEEN Final   Trich, Wet Prep NONE SEEN  NONE SEEN Final   Clue Cells Wet Prep HPF POC NONE SEEN  NONE SEEN Final   WBC, Wet Prep HPF POC FEW (*) NONE SEEN Final  GC/CHLAMYDIA PROBE AMP     Status: None   Collection Time    08/17/13  5:05 PM      Result Value Range Status   CT Probe RNA NEGATIVE  NEGATIVE Final   GC Probe RNA NEGATIVE  NEGATIVE Final   Comment: (NOTE)  Normal Reference Range: Negative          Assay performed using the Gen-Probe APTIMA COMBO2 (R) Assay.     Acceptable specimen types for this assay include APTIMA Swabs (Unisex,     endocervical, urethral, or vaginal), first void urine, and ThinPrep     liquid based cytology samples.     Performed at [facility] DIFFICILE BY PCR     Status: Abnormal   Collection Time    08/18/13  1:40 AM      Result Value Range Status   C difficile by pcr POSITIVE (*) NEGATIVE Final   Comment: CRITICAL RESULT CALLED TO, READ BACK BY AND VERIFIED WITH:     K OSBORNE,RN 08/18/13 0930 BY K SCHULTZ     Performed at [facility]     Studies: Ct Abdomen Pelvis W Contrast  08/17/2013   CLINICAL DATA:  Lower abdominal pain for past 3 weeks, at times greater on right, worse today, diarrhea,  nausea, vomiting, past history hypertension, endometriosis  EXAM: CT ABDOMEN AND PELVIS WITH CONTRAST  TECHNIQUE: Multidetector CT imaging of the abdomen and pelvis was performed using the standard protocol following bolus administration of intravenous contrast. Sagittal and coronal MPR images reconstructed from axial data set.  CONTRAST:  50mL OMNIPAQUE IOHEXOL 300 MG/ML SOLN orally, OMNIPAQUE IOHEXOL 300 MG/ML SOLN  COMPARISON:  08/08/2013  FINDINGS: Lung bases clear.  Tiny nonspecific low-attenuation focus anteriorly in liver image 8.  Remainder of liver, spleen, pancreas, kidneys, and adrenal glands normal appearance.  Normal appendix, ovaries, bladder, and ureters.  Small enhancing nodule at left lateral margin of uterus question leiomyoma 2.5 x 2.3 cm image 64.  Diffuse thickening colonic wall from rectum to cecum compatible with pancolitis.  Minimal scattered pericolic edema.  Stomach and small bowel loops normal appearance.  No mass, adenopathy, free fluid, or free air.  No hernia or acute osseous findings.  IMPRESSION: Diffuse colonic wall thickening from cecum to rectum compatible with pancolitis.  Differential diagnosis includes ulcerative colitis, Crohn's disease, and infectious etiologies.  Ischemia is felt unlikely due to age and distribution.  Small probable uterine leiomyoma.   Electronically Signed   By: Ulyses Southward M.D.   On: 08/17/2013 18:06    Scheduled Meds: . ciprofloxacin  400 mg Intravenous Q12H  . [START ON 08/19/2013] influenza vac split quadrivalent PF  0.5 mL Intramuscular Tomorrow-1000  . metronidazole  500 mg Intravenous Q8H  . [START ON 08/19/2013] pneumococcal 23 valent vaccine  0.5 mL Intramuscular Tomorrow-1000  . sertraline  50 mg Oral Daily   Continuous Infusions: . sodium chloride 75 mL/hr at 08/18/13 0539    Principal Problem:   Clostridium difficile colitis Active Problems:   Nausea vomiting and diarrhea   Colitis   Dehydration    Time spent: > 35  mins    [facility]  [facility] Pager [phone]. If 7PM-7AM, please contact night-coverage at [email or web address], password [facility] 08/18/2013, 10:36 AM  LOS: 1 day

## 2013-08-18 NOTE — Progress Notes (Signed)
Nutrition Brief Note  Patient identified on the Malnutrition Screening Tool (MST) Report  Wt Readings from Last 15 Encounters:  08/17/13 167 lb 8 oz (75.978 kg)  08/17/13 167 lb 8 oz (75.978 kg)  08/11/13 160 lb (72.576 kg)  08/08/13 161 lb (73.029 kg)  08/04/13 163 lb 9.6 oz (74.208 kg)  06/03/13 166 lb (75.297 kg)  02/27/13 174 lb 9.6 oz (79.198 kg)  12/03/12 167 lb 9.6 oz (76.023 kg)  06/28/12 157 lb (71.215 kg)  06/18/12 162 lb 9.6 oz (73.755 kg)  03/20/12 160 lb (72.576 kg)  12/09/11 150 lb (68.04 kg)    Body mass index is 29.2 kg/(m^2). Patient meets criteria for overweight based on current BMI.   Pt reports that she had a decreased appetite related to pain before coming to [facility]. She said that she now feels hungry and would like to eat as soon as she is no longer NPO for a colonoscopy. She did not feel that she needed any nutrition supplementation at this time.  Current diet order is NPO. Labs and medications reviewed.   No nutrition interventions warranted at this time. If nutrition issues arise, please consult RD.   Ebbie Latus RD, LDN

## 2013-08-18 NOTE — Consult Note (Signed)
Reason for Consult: Colitis Referring Physician: [facility]  Cay Schillings HPI: This is a 37 year old female with a 3-4 week history of abdominal pain.  The pain was located in the lower abdomen and in the past she had abdominal pain.  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  Recently a CT scan was ordered by her PCP and she was noted to have thickening in her descending colon and rectum.  A GI referral to [place] was placed, but her appointment was not until the end of September.  As her pain progressed she presented to the ER for further evaluation and treatment.  A repeat CT revealed a pan colitis and her mother has a history of Crohn's disease.  There is an associated nausea and vomiting when the pain worsened.  Her diarrhea did reveal some mild amount of blood.  Past Medical History  Diagnosis Date  . Ovarian cyst   . Hypertension   . Endometriosis     Past Surgical History  Procedure Laterality Date  . Ovarian cyst removal      Family History  Problem Relation Age of Onset  . Alzheimer's disease Mother   . Heart disease Father   . COPD Father     Social History:  reports that she quit smoking about 6 months ago. She does not have any smokeless tobacco history on file. She reports that she drinks about 2.4 ounces of alcohol per week. She reports that she does not use illicit drugs.  Allergies:  Allergies  Allergen Reactions  . Bee Venom Hives and Swelling    Medications:  Scheduled: . [START ON 08/19/2013] influenza vac split quadrivalent PF  0.5 mL Intramuscular Tomorrow-1000  . metronidazole  500 mg Intravenous Q8H  . [START ON 08/19/2013] pneumococcal 23 valent vaccine  0.5 mL Intramuscular Tomorrow-1000  . sertraline  50 mg Oral Daily   Continuous: . sodium chloride 125 mL/hr at 08/18/13 1044    Results for orders placed during the hospital encounter of 08/17/13 (from the past 24 hour(s))  CBC WITH DIFFERENTIAL     Status: Abnormal   Collection  Time    08/17/13  3:13 PM      Result Value Range   WBC 11.6 (*) 4.0 - 10.5 K/uL   RBC 4.38  3.87 - 5.11 MIL/uL   Hemoglobin 14.4  12.0 - 15.0 g/dL   HCT 11.9  14.7 - 82.9 %   MCV 98.6  78.0 - 100.0 fL   MCH 32.9  26.0 - 34.0 pg   MCHC 33.3  30.0 - 36.0 g/dL   RDW 56.2  13.0 - 86.5 %   Platelets 294  150 - 400 K/uL   Neutrophils Relative % 60  43 - 77 %   Neutro Abs 7.0  1.7 - 7.7 K/uL   Lymphocytes Relative 29  12 - 46 %   Lymphs Abs 3.3  0.7 - 4.0 K/uL   Monocytes Relative 9  3 - 12 %   Monocytes Absolute 1.1 (*) 0.1 - 1.0 K/uL   Eosinophils Relative 2  0 - 5 %   Eosinophils Absolute 0.2  0.0 - 0.7 K/uL   Basophils Relative 0  0 - 1 %   Basophils Absolute 0.0  0.0 - 0.1 K/uL  COMPREHENSIVE METABOLIC PANEL     Status: None   Collection Time    08/17/13  3:13 PM      Result Value Range   Sodium 137  135 - 145 mEq/L   Potassium 4.8  3.5 - 5.1 mEq/L   Chloride 105  96 - 112 mEq/L   CO2 23  19 - 32 mEq/L   Glucose, Bld 88  70 - 99 mg/dL   BUN 10  6 - 23 mg/dL   Creatinine, Ser 6.57  0.50 - 1.10 mg/dL   Calcium 9.3  8.4 - 84.6 mg/dL   Total Protein 7.6  6.0 - 8.3 g/dL   Albumin 4.1  3.5 - 5.2 g/dL   AST 30  0 - 37 U/L   ALT 24  0 - 35 U/L   Alkaline Phosphatase 64  39 - 117 U/L   Total Bilirubin 0.4  0.3 - 1.2 mg/dL   GFR calc non Af Amer >90  >90 mL/min   GFR calc Af Amer >90  >90 mL/min  LIPASE, BLOOD     Status: None   Collection Time    08/17/13  3:13 PM      Result Value Range   Lipase 18  11 - 59 U/L  URINALYSIS, ROUTINE W REFLEX MICROSCOPIC     Status: Abnormal   Collection Time    08/17/13  3:20 PM      Result Value Range   Color, Urine AMBER (*) YELLOW   APPearance TURBID (*) CLEAR   Specific Gravity, Urine 1.030  1.005 - 1.030   pH 5.5  5.0 - 8.0   Glucose, UA NEGATIVE  NEGATIVE mg/dL   Hgb urine dipstick NEGATIVE  NEGATIVE   Bilirubin Urine SMALL (*) NEGATIVE   Ketones, ur NEGATIVE  NEGATIVE mg/dL   Protein, ur NEGATIVE  NEGATIVE mg/dL    Urobilinogen, UA 0.2  0.0 - 1.0 mg/dL   Nitrite NEGATIVE  NEGATIVE   Leukocytes, UA MODERATE (*) NEGATIVE  URINE MICROSCOPIC-ADD ON     Status: Abnormal   Collection Time    08/17/13  3:20 PM      Result Value Range   Squamous Epithelial / LPF MANY (*) RARE   WBC, UA 7-10  <3 WBC/hpf   Bacteria, UA MANY (*) RARE   Urine-Other MUCOUS PRESENT    POCT PREGNANCY, URINE     Status: None   Collection Time    08/17/13  3:37 PM      Result Value Range   Preg Test, Ur NEGATIVE  NEGATIVE  WET PREP, GENITAL     Status: Abnormal   Collection Time    08/17/13  5:05 PM      Result Value Range   Yeast Wet Prep HPF POC NONE SEEN  NONE SEEN   Trich, Wet Prep NONE SEEN  NONE SEEN   Clue Cells Wet Prep HPF POC NONE SEEN  NONE SEEN   WBC, Wet Prep HPF POC FEW (*) NONE SEEN  GC/CHLAMYDIA PROBE AMP     Status: None   Collection Time    08/17/13  5:05 PM      Result Value Range   CT Probe RNA NEGATIVE  NEGATIVE   GC Probe RNA NEGATIVE  NEGATIVE  OCCULT BLOOD, POC DEVICE     Status: Abnormal   Collection Time    08/17/13  5:09 PM      Result Value Range   Fecal Occult Bld POSITIVE (*) NEGATIVE  CLOSTRIDIUM DIFFICILE BY PCR     Status: Abnormal   Collection Time    08/18/13  1:40 AM      Result Value Range   C difficile by pcr  POSITIVE (*) NEGATIVE  BASIC METABOLIC PANEL     Status: Abnormal   Collection Time    08/18/13  8:20 AM      Result Value Range   Sodium 135  135 - 145 mEq/L   Potassium 3.9  3.5 - 5.1 mEq/L   Chloride 106  96 - 112 mEq/L   CO2 20  19 - 32 mEq/L   Glucose, Bld 101 (*) 70 - 99 mg/dL   BUN 5 (*) 6 - 23 mg/dL   Creatinine, Ser 1.61  0.50 - 1.10 mg/dL   Calcium 8.3 (*) 8.4 - 10.5 mg/dL   GFR calc non Af Amer >90  >90 mL/min   GFR calc Af Amer >90  >90 mL/min  CBC     Status: Abnormal   Collection Time    08/18/13  8:20 AM      Result Value Range   WBC 8.8  4.0 - 10.5 K/uL   RBC 3.72 (*) 3.87 - 5.11 MIL/uL   Hemoglobin 12.3  12.0 - 15.0 g/dL   HCT 09.6  04.5 -  40.9 %   MCV 98.4  78.0 - 100.0 fL   MCH 33.1  26.0 - 34.0 pg   MCHC 33.6  30.0 - 36.0 g/dL   RDW 81.1  91.4 - 78.2 %   Platelets 241  150 - 400 K/uL  MAGNESIUM     Status: None   Collection Time    08/18/13  8:20 AM      Result Value Range   Magnesium 1.8  1.5 - 2.5 mg/dL     Ct Abdomen Pelvis W Contrast  08/17/2013   CLINICAL DATA:  Lower abdominal pain for past 3 weeks, at times greater on right, worse today, diarrhea, nausea, vomiting, past history hypertension, endometriosis  EXAM: CT ABDOMEN AND PELVIS WITH CONTRAST  TECHNIQUE: Multidetector CT imaging of the abdomen and pelvis was performed using the standard protocol following bolus administration of intravenous contrast. Sagittal and coronal MPR images reconstructed from axial data set.  CONTRAST:  50mL OMNIPAQUE IOHEXOL 300 MG/ML SOLN orally, OMNIPAQUE IOHEXOL 300 MG/ML SOLN  COMPARISON:  08/08/2013  FINDINGS: Lung bases clear.  Tiny nonspecific low-attenuation focus anteriorly in liver image 8.  Remainder of liver, spleen, pancreas, kidneys, and adrenal glands normal appearance.  Normal appendix, ovaries, bladder, and ureters.  Small enhancing nodule at left lateral margin of uterus question leiomyoma 2.5 x 2.3 cm image 64.  Diffuse thickening colonic wall from rectum to cecum compatible with pancolitis.  Minimal scattered pericolic edema.  Stomach and small bowel loops normal appearance.  No mass, adenopathy, free fluid, or free air.  No hernia or acute osseous findings.  IMPRESSION: Diffuse colonic wall thickening from cecum to rectum compatible with pancolitis.  Differential diagnosis includes ulcerative colitis, Crohn's disease, and infectious etiologies.  Ischemia is felt unlikely due to age and distribution.  Small probable uterine leiomyoma.   Electronically Signed   By: Ulyses Southward M.D.   On: 08/17/2013 18:06    ROS:  As stated above in the HPI otherwise negative.  Blood pressure 125/87, pulse 92, temperature 98.8 F (37.1  C), temperature source Oral, resp. rate 18, height 5' 3.5" (1.613 m), weight 167 lb 8 oz (75.978 kg), SpO2 99.00%.    PE: Gen: NAD, Alert and Oriented  Assessment/Plan: 1) C. Diff colitis. 2) ABM pain. 3) Nausea/Vomiting.   The patient's C. Diff was positive.  This was not the case at the time of  the consultation last evening.  When I was called I was not able to discern if she had an infectious etiology or IBD.  I opted to prep her for a colonoscopy in order to expedite her care.  Since she is C. Diff positive there is no need to perform the colonoscopy.  This is very upsetting to the patient as she suffered through the prep.  She understands my reasoning, but she is still unhappy with the situation.  Crohn's disease is a concern for her, but biopsies in the setting of C. Diff would be useless.  Plan: 1) Colonoscopy cancelled. 2) Treat her for C. Diff. 3) She wants to follow up with [facility].  Th ER PA note reports that she wanted to see Dr. Laural Benes at [place].  Regardless, she will need follow up with one of the two practices.  She does not want me to be involved in her care. 4) Signing off.  Chonda Baney D 08/18/2013, 11:56 AM

## 2013-08-18 NOTE — Progress Notes (Signed)
MEDICATION RELATED CONSULT NOTE - INITIAL   Pharmacy Consult for Flagyl Indication: CDiff positive  Allergies  Allergen Reactions  . Bee Venom Hives and Swelling   Patient Measurements: Height: 5' 3.5" (161.3 cm) Weight: 167 lb 8 oz (75.978 kg) IBW/kg (Calculated) : 53.55  Vital Signs: Temp: 98.8 F (37.1 C) (09/12 0639) Temp src: Oral (09/12 0639) BP: 125/87 mmHg (09/12 0639) Pulse Rate: 92 (09/12 0639) Intake/Output from previous day: 09/11 0701 - 09/12 0700 In: 590 [P.O.:590] Out: -  Intake/Output from this shift: Total I/O In: 1147.5 [P.O.:360; I.V.:787.5] Out: -   Labs:  Recent Labs  08/17/13 1513 08/18/13 0820  WBC 11.6* 8.8  HGB 14.4 12.3  HCT 43.2 36.6  PLT 294 241  CREATININE 0.71 0.65  ALBUMIN 4.1  --   PROT 7.6  --   AST 30  --   ALT 24  --   ALKPHOS 64  --   BILITOT 0.4  --    Estimated Creatinine Clearance: 94.2 ml/min (by C-G formula based on Cr of 0.65).  Microbiology: Stool PCR positive for CDiff   Medical History: Past Medical History  Diagnosis Date  . Ovarian cyst   . Hypertension   . Endometriosis    Medications:  Scheduled:  . ciprofloxacin  400 mg Intravenous Q12H  . [START ON 08/19/2013] influenza vac split quadrivalent PF  0.5 mL Intramuscular Tomorrow-1000  . metronidazole  500 mg Intravenous Q8H  . [START ON 08/19/2013] pneumococcal 23 valent vaccine  0.5 mL Intramuscular Tomorrow-1000  . sertraline  50 mg Oral Daily   Assessment: 37 yoF with 3-4 wk hx of crampy abdominal pain. Probable colitis per abdominal CT. IV Cipro and Flagyl begun 9/11, plan colonoscopy today. Admit for increased abdominal pain with N/V/D  Previous treatment with Cipro PTA  Frequent stooling, CDiff positive  WBC wnl, afebrile, no leukocytosis: Flagyl indicated x 14 days  IV Flagyl will treat CDiff, prefer oral route  Goal of Therapy:  Complete 14 days of Flagyl IV/PO, eradicate CDiff infection  Plan:   Continue IV abx till colonoscopy,  consider po Cipro/Flagyl  Recommend total 14 days Flagyl  Otho Bellows PharmD Pager [phone] 08/18/2013, 10:32 AM

## 2013-08-18 NOTE — Progress Notes (Signed)
CRITICAL VALUE ALERT  Critical value received:  C-diff positive  Date of notification:  t  Time of notification:  0930  Critical value read back:yes  Nurse who received alert:  Carlyle Lipa, RN  MD notified (1st page):  Dr. Janee Morn  Time of first page:  0935  MD notified (2nd page):  Time of second page:  Responding MD:  Dr. Janee Morn  Time MD responded:  [phone]

## 2013-08-18 NOTE — Care Management Note (Signed)
   CARE MANAGEMENT NOTE 08/18/2013  Patient:  [name], [name]   Account Number:  [id number]  Date Initiated:  08/18/2013  Documentation initiated by:  Tearra Ouk  Subjective/Objective Assessment:   37 yo female admitted with C-Diff. No PCP on record.     Action/Plan:   Home when stable   Anticipated DC Date:     Anticipated DC Plan:  HOME/SELF CARE      DC Planning Services  CM consult      Choice offered to / List presented to:  NA   DME arranged  NA      DME agency  NA     HH arranged  NA      HH agency  NA   Status of service:  In process, will continue to follow Medicare Important Message given?   (If response is "NO", the following Medicare IM given date fields will be blank) Date Medicare IM given:   Date Additional Medicare IM given:    Discharge Disposition:    Per UR Regulation:  Reviewed for med. necessity/level of care/duration of stay  If discussed at Long Length of Stay Meetings, dates discussed:    Comments:  08/18/13 1332 Jorje Vanatta,RN,MSN [phone] Chart reviewed for utilization of services. No PCP on record. Cm to provide pt with providers within network and [employer] clinic.

## 2013-08-19 LAB — CBC
HCT: 35.1 % — ABNORMAL LOW (ref 36.0–46.0)
Hemoglobin: 11.4 g/dL — ABNORMAL LOW (ref 12.0–15.0)
MCHC: 32.5 g/dL (ref 30.0–36.0)
WBC: 5.8 10*3/uL (ref 4.0–10.5)

## 2013-08-19 LAB — BASIC METABOLIC PANEL
BUN: 5 mg/dL — ABNORMAL LOW (ref 6–23)
Chloride: 112 mEq/L (ref 96–112)
GFR calc Af Amer: >90 mL/min (ref >90)
Potassium: 3.7 mEq/L (ref 3.5–5.1)

## 2013-08-19 MED ORDER — ONDANSETRON HCL 4 MG PO TABS: 4.0000 mg | ORAL_TABLET | Freq: Three times a day (TID) | ORAL | Status: DC | PRN

## 2013-08-19 MED ORDER — METRONIDAZOLE 500 MG PO TABS: 500.0000 mg | ORAL_TABLET | Freq: Three times a day (TID) | ORAL | Status: AC

## 2013-08-19 MED ORDER — OXYCODONE-ACETAMINOPHEN 5-325 MG PO TABS: 1.0000 | ORAL_TABLET | ORAL | Status: DC | PRN

## 2013-08-19 NOTE — Progress Notes (Signed)
Pt was stable at time of discharge. Removed pt's IV. Reviewed discharge packet with pt. She verbalized understanding. Gave pt her prescriptions.

## 2013-08-19 NOTE — Discharge Summary (Signed)
Physician Discharge Summary  [name] EXB:[id number] DOB: 10-Oct-1976 DOA: 08/17/2013  PCP: No PCP Per Patient  Admit date: 08/17/2013 Discharge date: 08/19/2013  Time spent: 60 minutes  Recommendations for Outpatient Follow-up:  1. Patient is to followup with PCP one week post discharge. On followup patient will need basic metabolic profile done to followup on electrolytes and renal function. 2. Patient is to followup with Dr. Annia Friendly gastroenterology 4 weeks post discharge.  Discharge Diagnoses:  Principal Problem:   Clostridium difficile colitis Active Problems:   Nausea vomiting and diarrhea   Colitis   Dehydration   Discharge Condition: Stable and improved  Diet recommendation: Regular  Filed Weights   08/17/13 2047  Weight: 75.978 kg (167 lb 8 oz)    History of present illness:  [name] is a 37 y.o. female has a past medical history significant for hypertension, endometriosis, prior history of ovarian cyst, presents to the ED with a chief complaint of lower abdominal pain progressively worsened for the past 3-4 weeks. Her pain is crampy in nature and is located in the right lower quadrant. She had a somewhat similar pain in the past, and at that time she was diagnosed with ovarian cysts. About a week ago she saw her primary care Dr. who ordered a CT of the abdomen pelvis which showed thickening of the sigmoid descending colon and rectum. She was referred to gastroenterology however she couldn't be seen but at the end of September. Patient's pain has progressively gotten worse in the last 2-3 days and she decided to come to the emergency room. She denies any fever or chills, denies any chest pain, shortness of breath, lightheadedness or dizziness. She denies any sick contacts. She denies any blood in the stool, however noticed bright red blood on the toilet paper. She has a family history of Crohn disease in her mother.   Hospital Course:  #1. C dIFF Colitis   Patient was admitted with abdominal pain. CT scan of the abdomen and pelvis which was ordered per PCP showed thickening of the sigmoid descending colon and the rectum. Patient's pain worsened and she presented to the ED. Patient was admitted and placed on MedSurg floor. Stool studies were obtained. Patient was placed empirically on IV Flagyl and ciprofloxacin. A GI consultation was obtained and patient was to be seen in consultation by Dr. Elnoria Howard. Initially her colonoscopy was scheduled however stool studies came back positive for C. Difficile colitis. Patient still IV ciprofloxacin was subsequently discontinued and IV Flagyl was changed to oral Flagyl. Patient was started on a diet which she tolerated. It was felt per GI that a colonoscopy was no longer warranted and stool studies have come back positive for C. difficile colitis. Was recommended to treat patient for C. difficile colitis and patient is to followup with GI as outpatient. Patient tolerated oral Flagyl symptoms improved and patient was discharged home in stable and improved condition.  #2 Dehydration  on admission patient was noted to be dehydrated. Patient was placed on IV fluids and was euvolemic per day of discharge.   #3 N/V/D/abd pain  Secondary to # 1.     Procedures:  None  Consultations:  GI: Dr. Elnoria Howard 08/18/2013  Discharge Exam: Filed Vitals:   08/19/13 0613  BP: 121/79  Pulse: 89  Temp: 97.9 F (36.6 C)  Resp: 18    General: NAD Cardiovascular: RRR Respiratory: CTAB  Discharge Instructions  Discharge Orders   Future Orders Complete By Expires  Diet general  As directed    Discharge instructions  As directed    Comments:     Follow up with Dr Perrin Maltese in 1 week. Follow up with Dr Laural Benes in 4 weeks   Increase activity slowly  As directed        Medication List    STOP taking these medications       ciprofloxacin 500 MG tablet  Commonly known as:  CIPRO     HYDROcodone-acetaminophen 7.5-325 mg/15  ml solution  Commonly known as:  HYCET      TAKE these medications       B-complex with vitamin C tablet  Take 1 tablet by mouth daily.     calcium carbonate 600 MG Tabs tablet  Commonly known as:  OS-CAL  Take 600 mg by mouth daily with breakfast.     clonazePAM 1 MG tablet  Commonly known as:  KLONOPIN  Take 1 tablet (1 mg total) by mouth 2 (two) times daily as needed for anxiety.     EPINEPHrine 0.3 mg/0.3 mL Devi  Commonly known as:  EPIPEN  Inject 0.3 mLs (0.3 mg total) into the muscle once.     medroxyPROGESTERone 150 MG/ML injection  Commonly known as:  DEPO-PROVERA  Inject 150 mg into the muscle every 3 (three) months.     metroNIDAZOLE 500 MG tablet  Commonly known as:  FLAGYL  Take 1 tablet (500 mg total) by mouth every 8 (eight) hours. Take for 13 days then stop.     multivitamin with minerals Tabs tablet  Take 1 tablet by mouth daily.     ondansetron 4 MG tablet  Commonly known as:  ZOFRAN  Take 1 tablet (4 mg total) by mouth every 8 (eight) hours as needed for nausea.     oxyCODONE-acetaminophen 5-325 MG per tablet  Commonly known as:  PERCOCET  Take 1 tablet by mouth every 4 (four) hours as needed for pain.     sertraline 50 MG tablet  Commonly known as:  ZOLOFT  Take 1 tablet (50 mg total) by mouth daily.       Allergies  Allergen Reactions  . Bee Venom Hives and Swelling       Follow-up Information   Follow up with Charolett Bumpers, MD. Schedule an appointment as soon as possible for a visit in 1 month.   Specialty:  Gastroenterology   Contact information:   [address] [place] [address] [phone]       Follow up with GUEST, Loretha Stapler, MD. Schedule an appointment as soon as possible for a visit in 1 week.   Specialty:  Internal Medicine   Contact information:   [address] [place] [address] [phone]        The results of significant diagnostics from  this hospitalization (including imaging, microbiology, ancillary and laboratory) are listed below for reference.    Significant Diagnostic Studies: Ct Abdomen Pelvis W Contrast  08/17/2013   CLINICAL DATA:  Lower abdominal pain for past 3 weeks, at times greater on right, worse today, diarrhea, nausea, vomiting, past history hypertension, endometriosis  EXAM: CT ABDOMEN AND PELVIS WITH CONTRAST  TECHNIQUE: Multidetector CT imaging of the abdomen and pelvis was performed using the standard protocol following bolus administration of intravenous contrast. Sagittal and coronal MPR images reconstructed from axial  data set.  CONTRAST:  50mL OMNIPAQUE IOHEXOL 300 MG/ML SOLN orally, OMNIPAQUE IOHEXOL 300 MG/ML SOLN  COMPARISON:  08/08/2013  FINDINGS: Lung bases clear.  Tiny nonspecific low-attenuation focus anteriorly in liver image 8.  Remainder of liver, spleen, pancreas, kidneys, and adrenal glands normal appearance.  Normal appendix, ovaries, bladder, and ureters.  Small enhancing nodule at left lateral margin of uterus question leiomyoma 2.5 x 2.3 cm image 64.  Diffuse thickening colonic wall from rectum to cecum compatible with pancolitis.  Minimal scattered pericolic edema.  Stomach and small bowel loops normal appearance.  No mass, adenopathy, free fluid, or free air.  No hernia or acute osseous findings.  IMPRESSION: Diffuse colonic wall thickening from cecum to rectum compatible with pancolitis.  Differential diagnosis includes ulcerative colitis, Crohn's disease, and infectious etiologies.  Ischemia is felt unlikely due to age and distribution.  Small probable uterine leiomyoma.   Electronically Signed   By: Ulyses Southward M.D.   On: 08/17/2013 18:06   Ct Abdomen Pelvis W Contrast  08/08/2013   CLINICAL DATA:  Right abdominal pain. Diarrhea and nausea.  EXAM: CT ABDOMEN AND PELVIS WITH CONTRAST  TECHNIQUE: Multidetector CT imaging of the abdomen and pelvis was performed using the standard protocol  following bolus administration of intravenous contrast.  CONTRAST:  30mL OMNIPAQUE IOHEXOL 300 MG/ML SOLN, OMNIPAQUE IOHEXOL 300 MG/ML SOLN  COMPARISON:  Multiple exams, including 06/28/2012 and 03/30/2011  FINDINGS: The liver, spleen, pancreas, and adrenal glands appear unremarkable. Kidneys unremarkable. Gallbladder normal. No pathologic adenopathy.  Adnexa unremarkable. 2.2 cm fibroid along the left posterior uterine body.  Appendix normal. Mild wall thickening in the sigmoid colon, descending colon, and rectum.  IMPRESSION: 1. Mild wall thickening in the sigmoid colon, descending colon, and rectum. Differential diagnostic considerations include infectious colitis and inflammatory bowel disease. 2. Uterine fibroid noted.   Electronically Signed   By: Herbie Baltimore   On: 08/08/2013 13:55    Microbiology: Recent Results (from the past 240 hour(s))  URINE CULTURE     Status: None   Collection Time    08/17/13  3:20 PM      Result Value Range Status   Specimen Description URINE, CLEAN CATCH   Final   Special Requests NONE   Final   Culture  Setup Time     Final   Value: 08/17/2013 21:03     Performed at [facility] Count     Final   Value: 30,000 COLONIES/ML     Performed at [facility]   Culture     Final   Value: Multiple bacterial morphotypes present, none predominant. Suggest appropriate recollection if clinically indicated.     Performed at [facility]   Report Status 08/18/2013 FINAL   Final  WET PREP, GENITAL     Status: Abnormal   Collection Time    08/17/13  5:05 PM      Result Value Range Status   Yeast Wet Prep HPF POC NONE SEEN  NONE SEEN Final   Trich, Wet Prep NONE SEEN  NONE SEEN Final   Clue Cells Wet Prep HPF POC NONE SEEN  NONE SEEN Final   WBC, Wet Prep HPF POC FEW (*) NONE SEEN Final  GC/CHLAMYDIA PROBE AMP     Status: None   Collection Time    08/17/13  5:05 PM      Result Value Range Status   CT Probe RNA NEGATIVE   NEGATIVE Final  GC Probe RNA NEGATIVE  NEGATIVE Final   Comment: (NOTE)                                                                                              Normal Reference Range: Negative          Assay performed using the Gen-Probe APTIMA COMBO2 (R) Assay.     Acceptable specimen types for this assay include APTIMA Swabs (Unisex,     endocervical, urethral, or vaginal), first void urine, and ThinPrep     liquid based cytology samples.     Performed at [facility] DIFFICILE BY PCR     Status: Abnormal   Collection Time    08/18/13  1:40 AM      Result Value Range Status   C difficile by pcr POSITIVE (*) NEGATIVE Final   Comment: CRITICAL RESULT CALLED TO, READ BACK BY AND VERIFIED WITH:     K OSBORNE,RN 08/18/13 0930 BY K SCHULTZ     Performed at [facility]     Labs: Basic Metabolic Panel:  Recent Labs Lab 08/17/13 1513 08/18/13 0820 08/19/13 0340  NA 137 135 140  K 4.8 3.9 3.7  CL 105 106 112  CO2 23 20 23   GLUCOSE 88 101* 135*  BUN 10 5* 5*  CREATININE 0.71 0.65 0.69  CALCIUM 9.3 8.3* 8.0*  MG  --  1.8  --    Liver Function Tests:  Recent Labs Lab 08/17/13 1513  AST 30  ALT 24  ALKPHOS 64  BILITOT 0.4  PROT 7.6  ALBUMIN 4.1    Recent Labs Lab 08/17/13 1513  LIPASE 18   No results found for this basename: AMMONIA,  in the last 168 hours CBC:  Recent Labs Lab 08/17/13 1513 08/18/13 0820 08/19/13 0340  WBC 11.6* 8.8 5.8  NEUTROABS 7.0  --   --   HGB 14.4 12.3 11.4*  HCT 43.2 36.6 35.1*  MCV 98.6 98.4 99.4  PLT 294 241 247   Cardiac Enzymes: No results found for this basename: CKTOTAL, CKMB, CKMBINDEX, TROPONINI,  in the last 168 hours BNP: BNP (last 3 results) No results found for this basename: PROBNP,  in the last 8760 hours CBG: No results found for this basename: GLUCAP,  in the last 168 hours     Signed:  Brande Uncapher  [facility] 08/19/2013, 11:32 AM

## 2013-08-21 ENCOUNTER — Emergency Department (HOSPITAL_COMMUNITY)
Admission: EM | Admit: 2013-08-21 | Discharge: 2013-08-21 | Disposition: A | Discharge status: Home / Self Care | Payer: BC Managed Care – PPO | Attending: Emergency Medicine | Admitting: Emergency Medicine

## 2013-08-21 ENCOUNTER — Encounter (HOSPITAL_COMMUNITY): Payer: Self-pay | Admitting: *Deleted

## 2013-08-21 ENCOUNTER — Emergency Department (HOSPITAL_COMMUNITY): Payer: BC Managed Care – PPO

## 2013-08-21 DIAGNOSIS — Z79899 Other long term (current) drug therapy: Secondary | ICD-10-CM | POA: Insufficient documentation

## 2013-08-21 DIAGNOSIS — Z87891 Personal history of nicotine dependence: Secondary | ICD-10-CM | POA: Insufficient documentation

## 2013-08-21 DIAGNOSIS — R6883 Chills (without fever): Secondary | ICD-10-CM | POA: Insufficient documentation

## 2013-08-21 DIAGNOSIS — Z8742 Personal history of other diseases of the female genital tract: Secondary | ICD-10-CM | POA: Insufficient documentation

## 2013-08-21 DIAGNOSIS — I10 Essential (primary) hypertension: Secondary | ICD-10-CM | POA: Insufficient documentation

## 2013-08-21 DIAGNOSIS — A0472 Enterocolitis due to Clostridium difficile, not specified as recurrent: Secondary | ICD-10-CM | POA: Insufficient documentation

## 2013-08-21 HISTORY — DX: Enterocolitis due to Clostridium difficile, not specified as recurrent: A04.72

## 2013-08-21 LAB — HEPATIC FUNCTION PANEL
ALT: 34 U/L (ref 0–35)
AST: 31 U/L (ref 0–37)
Alkaline Phosphatase: 68 U/L (ref 39–117)
Bilirubin, Direct: <0.1 mg/dL (ref 0.0–0.3)
Total Bilirubin: 0.2 mg/dL — ABNORMAL LOW (ref 0.3–1.2)

## 2013-08-21 LAB — BASIC METABOLIC PANEL
BUN: 11 mg/dL (ref 6–23)
Chloride: 103 mEq/L (ref 96–112)
Glucose, Bld: 95 mg/dL (ref 70–99)
Potassium: 4.1 mEq/L (ref 3.5–5.1)

## 2013-08-21 LAB — CBC WITH DIFFERENTIAL/PLATELET
Basophils Relative: 1 % (ref 0–1)
Eosinophils Absolute: 0.1 10*3/uL (ref 0.0–0.7)
MCH: 33 pg (ref 26.0–34.0)
MCHC: 34.2 g/dL (ref 30.0–36.0)
Neutrophils Relative %: 60 % (ref 43–77)
Platelets: 310 10*3/uL (ref 150–400)

## 2013-08-21 MED ORDER — KETOROLAC TROMETHAMINE 10 MG PO TABS: 10.0000 mg | ORAL_TABLET | Freq: Four times a day (QID) | ORAL | Status: DC | PRN

## 2013-08-21 MED ORDER — HYDROMORPHONE HCL PF 1 MG/ML IJ SOLN
0.5000 mg | Freq: Once | INTRAMUSCULAR | Status: AC
  Administered 2013-08-21: 0.5 mg via INTRAVENOUS
  Filled 2013-08-21: qty 1

## 2013-08-21 MED ORDER — OXYCODONE-ACETAMINOPHEN 5-325 MG PO TABS: ORAL_TABLET | ORAL | Status: DC

## 2013-08-21 MED ORDER — IOHEXOL 300 MG/ML  SOLN
50.0000 mL | Freq: Once | INTRAMUSCULAR | Status: AC | PRN
  Administered 2013-08-21: 50 mL via ORAL

## 2013-08-21 MED ORDER — ONDANSETRON HCL 4 MG/2ML IJ SOLN
4.0000 mg | Freq: Once | INTRAMUSCULAR | Status: AC
  Administered 2013-08-21: 4 mg via INTRAVENOUS
  Filled 2013-08-21: qty 2

## 2013-08-21 NOTE — ED Notes (Signed)
IV Team unable to start IV.

## 2013-08-21 NOTE — ED Provider Notes (Signed)
CSN: [id number]     Arrival date & time 08/21/13  0906 History   First MD Initiated Contact with Patient 08/21/13 1002     Chief Complaint  Patient presents with  . Cdiff, abdominal pain    (Consider location/radiation/quality/duration/timing/severity/associated sxs/prior Treatment) HPI  [name] is a 37 y.o. female with past medical history significant for endometriosis and C. difficile colitis, recently diagnosed and released from the hospital 3 days ago complaining of bilateral lower abdominal pain, described as 8/10, crampy.  Patient has been taking her antibiotics as directed, she states that she is taking Percocet 5 mg every 4 hours as needed and if his help the pain but it is not completely eliminating it. States that the pain wakes her from sleep at night. She denies fever but endorses chills, she is still having loose stool. She does not have any nausea or vomiting, chest pain, shortness of breath, dysuria, hematuria, abnormal vaginal discharge..  Past Medical History  Diagnosis Date  . Ovarian cyst   . Hypertension   . Endometriosis   . C. difficile diarrhea     Sept 2014   Past Surgical History  Procedure Laterality Date  . Ovarian cyst removal     Family History  Problem Relation Age of Onset  . Alzheimer's disease Mother   . Heart disease Father   . COPD Father    History  Substance Use Topics  . Smoking status: Former Smoker -- 0.50 packs/day    Quit date: 02/04/2013  . Smokeless tobacco: Not on file  . Alcohol Use: 2.4 oz/week    4 Glasses of wine per week     Comment: "Good week 2 bottles of wine, bad week 4 bottles of wine"   OB History   Grav Para Term Preterm Abortions TAB SAB Ect Mult Living   2 1 1  0 1 0 1 0 0 1     Review of Systems 10 systems reviewed and found to be negative, except as noted in the HPI  Allergies  Bee venom  Home Medications   Current Outpatient Rx  Name  Route  Sig  Dispense  Refill  . B Complex-C (B-COMPLEX WITH  VITAMIN C) tablet   Oral   Take 1 tablet by mouth daily.         . calcium carbonate (OS-CAL) 600 MG TABS tablet   Oral   Take 600 mg by mouth daily with breakfast.         . clonazePAM (KLONOPIN) 1 MG tablet   Oral   Take 1 tablet (1 mg total) by mouth 2 (two) times daily as needed for anxiety.   30 tablet   3   . EPINEPHrine (EPIPEN) 0.3 mg/0.3 mL DEVI   Intramuscular   Inject 0.3 mLs (0.3 mg total) into the muscle once.   2 Device   2   . medroxyPROGESTERone (DEPO-PROVERA) 150 MG/ML injection   Intramuscular   Inject 150 mg into the muscle every 3 (three) months.         . metroNIDAZOLE (FLAGYL) 500 MG tablet   Oral   Take 1 tablet (500 mg total) by mouth every 8 (eight) hours. Take for 13 days then stop.   40 tablet   0   . Multiple Vitamin (MULTIVITAMIN WITH MINERALS) TABS tablet   Oral   Take 1 tablet by mouth daily.         . ondansetron (ZOFRAN) 4 MG tablet   Oral  Take 1 tablet (4 mg total) by mouth every 8 (eight) hours as needed for nausea.   20 tablet   0   . oxyCODONE-acetaminophen (PERCOCET) 5-325 MG per tablet   Oral   Take 1 tablet by mouth every 4 (four) hours as needed for pain.   20 tablet   0   . sertraline (ZOLOFT) 50 MG tablet   Oral   Take 1 tablet (50 mg total) by mouth daily.   30 tablet   3    BP 157/98  Pulse 88  Temp(Src) 98.9 F (37.2 C) (Oral)  Resp 16  SpO2 100% Physical Exam  Nursing note and vitals reviewed. Constitutional: She is oriented to person, place, and time. She appears well-developed and well-nourished. No distress.  HENT:  Head: Normocephalic.  Eyes: Conjunctivae and EOM are normal.  Cardiovascular: Normal rate.   Pulmonary/Chest: Effort normal. No stridor.  Musculoskeletal: Normal range of motion.  Neurological: She is alert and oriented to person, place, and time.  Psychiatric: She has a normal mood and affect.    ED Course  Procedures (including critical care time) Labs Review Labs  Reviewed  HEPATIC FUNCTION PANEL - Abnormal; Notable for the following:    Total Bilirubin 0.2 (*)    All other components within normal limits  CBC WITH DIFFERENTIAL - Abnormal; Notable for the following:    Hemoglobin 15.1 (*)    All other components within normal limits  BASIC METABOLIC PANEL  CBC WITH DIFFERENTIAL   Imaging Review Ct Abdomen Pelvis Wo Contrast  08/21/2013   CLINICAL DATA:  Abdominal pain with nausea, vomiting, and diarrhea  EXAM: CT ABDOMEN AND PELVIS WITHOUT CONTRAST  TECHNIQUE: Multidetector CT imaging of the abdomen and pelvis was performed following the standard protocol without intravenous contrast. Oral contrast was administered.  COMPARISON:  August 17, 2013  FINDINGS: Lung bases are clear.  No focal liver lesions are identified on this non contrast enhanced study. There is no biliary duct dilatation.  Spleen, pancreas, and adrenals appear normal. Kidneys bilaterally show no appreciable mass, calculus, or hydronephrosis on either side. There is no ureteral calculus or ureterectasis on either side. There are several pelvic phleboliths adjacent to the ureters but separate from the ureters.  In the pelvis, there is a leiomyoma arising from the left side of the uterus, better seen on the recent contrast-enhanced study. It measures 2.5 x 2.1 cm in size. There is no other pelvic mass seen. There is no pelvic fluid. Appendix appears normal. There is lipomatous infiltration of the ileocecal valve.  There is considerably less thickening of the wall of the colon compared to recent prior study. There is still some diffuse colitis. The surrounding mesentery appears normal, and there is no abscess.  There are scattered subcentimeter lymph nodes throughout the mesentery. By size criteria, however, there is no adenopathy in the abdomen or pelvis.  There is no abscess or ascites in the abdomen or pelvis. Aorta is non aneurysmal. There are no blastic lytic bone lesions.  IMPRESSION: There  is less diffuse colonic wall thickening compared to recent prior study. There is still some diffuse colitis present, however. There is no bowel obstruction or abscess. Multiple subcentimeter lymph nodes are scattered throughout the mesenteric, probably reactive etiology given the colitis.  Appendix region appears normal.  Uterine leiomyoma, stable.   Electronically Signed   By: Bretta Bang   On: 08/21/2013 13:54    MDM  No diagnosis found. Filed Vitals:   08/21/13 1610  08/21/13 1342 08/21/13 1345  BP: 157/98 138/87 138/87  Pulse: 88 77 80  Temp: 98.9 F (37.2 C)    TempSrc: Oral    Resp: 16  18  SpO2: 100% 99% 98%     MARGURETTE BRENER is a 37 y.o. female with steady but severe lower abdominal pain actively being treated for C. difficile colitis, compliant with Flagyl, no other associated symptoms. Abdominal exam is nonsurgical. Patient has had 2 CTs of the last several weeks. I discussed with the patient and my attending physician value of obtaining another CT. I discussed the risk of radiation and potential cancer with the patient and she has opted for further imaging with CT at this time.  CT shows improvement in the colonic wall thickening but there is still diffuse colitis, there is also several lymph nodes scattered throughout the mesentery there probably reactive. Blood work is otherwise unremarkable.  Pain is well-controlled in the ED, I have asked her to increase her Percocet dosage to 1-2 tabs every 4-6 hours and additionally I will start her on by mouth Toradol.  Medications  HYDROmorphone (DILAUDID) injection 0.5 mg (0.5 mg Intravenous Given 08/21/13 1331)  ondansetron (ZOFRAN) injection 4 mg (4 mg Intravenous Given 08/21/13 1319)  iohexol (OMNIPAQUE) 300 MG/ML solution 50 mL (50 mLs Oral Contrast Given 08/21/13 1145)    Pt is hemodynamically stable, appropriate for, and amenable to discharge at this time. Pt verbalized understanding and agrees with care plan. All questions  answered. Outpatient follow-up and specific return precautions discussed.    Discharge Medication List as of 08/21/2013  2:44 PM    START taking these medications   Details  ketorolac (TORADOL) 10 MG tablet Take 1 tablet (10 mg total) by mouth every 6 (six) hours as needed for pain (Take with food. Do not take more than 4 per day. Do not take for longer than 5 days)., Starting 08/21/2013, Until Discontinued, Print        Note: Portions of this report may have been transcribed using voice recognition software. Every effort was made to ensure accuracy; however, inadvertent computerized transcription errors may be present      Wynetta Emery, PA-C 08/21/13 1557

## 2013-08-21 NOTE — ED Notes (Signed)
Pt was d/c on Saturday from hospital after being treated for CDIFF. D/c with flagyl, complaint with medication. Pt reports diarrhea is still present. Diarrhea x8 for Saturday and Sunday combined. Reports intermittent fever with chills.  Abdominal pain 6/10.

## 2013-08-21 NOTE — Progress Notes (Signed)
[facility] ED CM noted pt with coverage but no pcp listed Spoke with pt who confirms no pcp  [facility] ED CM spoke with pt on how to obtain an in network pcp with insurance coverage via the customer service number or web site  CM encouraged pt and discussed pt's responsibility to verify with pt's insurance carrier that any recommended medical provider offered by any emergency room or a hospital provider is within the carrier's network. The pt voiced understanding      

## 2013-08-21 NOTE — ED Notes (Signed)
Patient transported to CT 

## 2013-08-21 NOTE — ED Notes (Signed)
IV Team unable to obtain an IV site using a handheld [place]. Attempts x 2.

## 2013-08-21 NOTE — ED Notes (Signed)
Bed: ZO10 Expected date:  Expected time:  Means of arrival:  Comments: Cathleen Fears

## 2013-08-23 NOTE — ED Provider Notes (Signed)
Medical screening examination/treatment/procedure(s) were performed by non-physician practitioner and as supervising physician I was immediately available for consultation/collaboration.   Laray Anger, DO 08/23/13 2310

## 2013-08-24 ENCOUNTER — Other Ambulatory Visit: Payer: Self-pay | Admitting: Internal Medicine

## 2013-08-25 ENCOUNTER — Other Ambulatory Visit: Payer: Self-pay | Admitting: Internal Medicine

## 2013-08-26 ENCOUNTER — Ambulatory Visit: Payer: BC Managed Care – PPO

## 2013-08-26 ENCOUNTER — Telehealth: Payer: Self-pay | Admitting: *Deleted

## 2013-08-26 ENCOUNTER — Ambulatory Visit (INDEPENDENT_AMBULATORY_CARE_PROVIDER_SITE_OTHER): Payer: BC Managed Care – PPO | Admitting: Family Medicine

## 2013-08-26 VITALS — BP 126/76 | HR 82 | Temp 99.2°F | Resp 17 | Ht 63.5 in | Wt 163.0 lb

## 2013-08-26 DIAGNOSIS — A0472 Enterocolitis due to Clostridium difficile, not specified as recurrent: Secondary | ICD-10-CM

## 2013-08-26 DIAGNOSIS — R109 Unspecified abdominal pain: Secondary | ICD-10-CM

## 2013-08-26 LAB — POCT CBC
Granulocyte percent: 56.7 %G (ref 37–80)
HCT, POC: 42.9 % (ref 37.7–47.9)
Lymph, poc: 3.5 — AB (ref 0.6–3.4)
MCH, POC: 32.5 pg — AB (ref 27–31.2)
MCHC: 31.2 g/dL — AB (ref 31.8–35.4)
MCV: 104.1 fL — AB (ref 80–97)
MID (cbc): 0.9 (ref 0–0.9)
POC LYMPH PERCENT: 34.5 %L (ref 10–50)
Platelet Count, POC: 328 10*3/uL (ref 142–424)
RDW, POC: 14.4 %
WBC: 10.2 10*3/uL (ref 4.6–10.2)

## 2013-08-26 MED ORDER — HYDROCODONE-ACETAMINOPHEN 7.5-325 MG/15ML PO SOLN: 15.0000 mL | Freq: Four times a day (QID) | ORAL | Status: DC | PRN

## 2013-08-26 NOTE — Telephone Encounter (Signed)
Pt called and stated that she wanted an rx for liquid hydrocodone syrup. I looked at the ov notes from the ED.  She has an appt with GI on the 24th.  It also states to follow up with primary care within 24-48hrs.  I advised her that she must come in.  She was a little concerned about waiting in the waiting room because of her current dx of cdiff and stated she will wait until her GI appt.  I spoke with Dr. Katrinka Blazing to ask her about how contagious is the Cdiff and was it ok for pt to wait out in the waiting area?  She stated that it is contagious but it was ok for pt to come in to wait in lobby.  I called pt back to advise her that the wait was not bad at the moment and that it would be a good time to come in and the one of the providers stated that it was ok to wait in the lobby and that we would get her taken care of today.

## 2013-08-26 NOTE — Progress Notes (Signed)
[address]   [place], [place]  [address]   [phone]  Subjective:    Patient ID: [name], female    DOB: Jul 04, 1976, 37 y.o.   MRN: [id number]  HPI This 38 y.o. female presents for hospital follow-up for C. Difficile colitis.  Evaluated by Dr. Perrin Maltese for RLQ pain; s/p CT scan negative for appendicitis but L sided colitis; prescribed Cipro.  Presented three days later to ED for worsening abdominal pain.  S/p CT during admission which suggested Crohn's disease.  Obtained stool cultures that showed C. Difficile colitis.  Discharged on Metronidazole, Percocet.  Three days later, presented to ED due to persistent severe pain.  S/p Toradol injection; prescribed Percocet.  Last ED visit on 08/21/13, prescribed Toradol, Dilaudid, anti-nausea medication.  Underwent repeat CT scan on 08/21/13 that showed decreased colitis. Metronidazole every 8 hours. Toradol every 6 hours. Percocet #10 on 08/21/13; take 1-2 for breakthrough pain; completed those within 48 hours. Zofran every 8 hours. Pain is worsening since 08/21/13.   Having episodic pain; usually during the day 4 times; usually occurs at 4:00am.  Stabbing pain last 20 minutes to 1.5 hours.  Still concerned about Crohn's disease. Mother with Crohn's disease.  Mother had 16 inches of colon removed.   No fever but +chills/sweats with acute pain. +nausea; no vomiting; not eating much; no appetite.   +diarrhea less frequent; having 3 stools per day; large volumes; non-bloody.  With onset, minimal stools; after first CT scan, developed diarrhea 6-10 stools per day. Drinking excessive amounts of water.  Appointment with GI Wednesday with Dr. Christella Hartigan of [facility].  Severity 8/10.  Review of Systems  Constitutional: Negative for fever, chills, diaphoresis and fatigue.  Gastrointestinal: Positive for nausea, abdominal pain and diarrhea. Negative for vomiting, constipation and blood in stool.  Neurological: Negative for dizziness and light-headedness.     Past Medical History  Diagnosis Date  . Ovarian cyst   . Hypertension   . Endometriosis   . C. difficile diarrhea     Sept 2014    Past Surgical History  Procedure Laterality Date  . Ovarian cyst removal      Prior to Admission medications   Medication Sig Start Date End Date Taking? Authorizing Provider  B Complex-C (B-COMPLEX WITH VITAMIN C) tablet Take 1 tablet by mouth daily.   Yes Historical Provider, MD  calcium carbonate (OS-CAL) 600 MG TABS tablet Take 600 mg by mouth daily with breakfast.   Yes Historical Provider, MD  clonazePAM (KLONOPIN) 1 MG tablet Take 1 tablet (1 mg total) by mouth 2 (two) times daily as needed for anxiety. 08/04/13  Yes Jonita Albee, MD  EPINEPHrine (EPIPEN) 0.3 mg/0.3 mL DEVI Inject 0.3 mLs (0.3 mg total) into the muscle once. 06/18/12  Yes Peyton Najjar, MD  ketorolac (TORADOL) 10 MG tablet Take 1 tablet (10 mg total) by mouth every 6 (six) hours as needed for pain (Take with food. Do not take more than 4 per day. Do not take for longer than 5 days). 08/21/13  Yes Nicole Pisciotta, PA-C  medroxyPROGESTERone (DEPO-PROVERA) 150 MG/ML injection Inject 150 mg into the muscle every 3 (three) months.   Yes Historical Provider, MD  metroNIDAZOLE (FLAGYL) 500 MG tablet Take 1 tablet (500 mg total) by mouth every 8 (eight) hours. Take for 13 days then stop. 08/19/13 09/01/13 Yes Rodolph Bong, MD  Multiple Vitamin (MULTIVITAMIN WITH MINERALS) TABS tablet Take 1 tablet by mouth daily.   Yes Historical Provider,  MD  ondansetron (ZOFRAN) 4 MG tablet Take 1 tablet (4 mg total) by mouth every 8 (eight) hours as needed for nausea. 08/19/13  Yes Rodolph Bong, MD  oxyCODONE-acetaminophen (PERCOCET) 5-325 MG per tablet Take 1-2 tabs every 4-6 hours as needed for pain 08/21/13  Yes Nicole Pisciotta, PA-C  sertraline (ZOLOFT) 50 MG tablet Take 1 tablet (50 mg total) by mouth daily. 08/04/13  Yes Jonita Albee, MD    Allergies  Allergen Reactions  . Bee Venom  Hives and Swelling    History   Social History  . Marital Status: Married    Spouse Name: N/A    Number of Children: N/A  . Years of Education: N/A   Occupational History  . Not on file.   Social History Main Topics  . Smoking status: Former Smoker -- 0.50 packs/day    Quit date: 02/04/2013  . Smokeless tobacco: Not on file  . Alcohol Use: 2.4 oz/week    4 Glasses of wine per week     Comment: "Good week 2 bottles of wine, bad week 4 bottles of wine"  . Drug Use: No  . Sexual Activity: Yes    Birth Control/ Protection: Injection, None   Other Topics Concern  . Not on file   Social History Narrative  . No narrative on file    Family History  Problem Relation Age of Onset  . Alzheimer's disease Mother   . Heart disease Father   . COPD Father        Objective:   Physical Exam  Nursing note and vitals reviewed. Constitutional: She is oriented to person, place, and time. She appears well-developed and well-nourished. No distress.  HENT:  Head: Normocephalic and atraumatic.  Mouth/Throat: Oropharynx is clear and moist.  Cardiovascular: Normal rate, regular rhythm and normal heart sounds.   Pulmonary/Chest: Effort normal and breath sounds normal. She has no wheezes. She has no rales.  Abdominal: Soft. Bowel sounds are normal. She exhibits no distension and no mass. There is tenderness in the right lower quadrant and suprapubic area. There is guarding. There is no rebound.  Neurological: She is alert and oriented to person, place, and time.  Skin: No rash noted. She is not diaphoretic.  Psychiatric: She has a normal mood and affect. Her behavior is normal.   [facility] reading (PRIMARY) by  Dr. Katrinka Blazing.  AAS:  No free air; no obstruction.  Results for orders placed in visit on 08/26/13  POCT CBC      Result Value Range   WBC 10.2  4.6 - 10.2 K/uL   Lymph, poc 3.5 (*) 0.6 - 3.4   POC LYMPH PERCENT 34.5  10 - 50 %L   MID (cbc) 0.9  0 - 0.9   POC MID % 8.8  0 - 12 %M    POC Granulocyte 5.8  2 - 6.9   Granulocyte percent 56.7  37 - 80 %G   RBC 4.12  4.04 - 5.48 M/uL   Hemoglobin 13.4  12.2 - 16.2 g/dL   HCT, POC 16.1  09.6 - 47.9 %   MCV 104.1 (*) 80 - 97 fL   MCH, POC 32.5 (*) 27 - 31.2 pg   MCHC 31.2 (*) 31.8 - 35.4 g/dL   RDW, POC 04.5     Platelet Count, POC 328  142 - 424 K/uL   MPV 8.9  0 - 99.8 fL      Assessment & Plan:  Abdominal  pain, other  specified site - Plan: POCT CBC, POCT SEDIMENTATION RATE, DG Abd Acute W/Chest  Clostridium difficile enteritis  1. Abdominal pain diffuse:  Persistent/worsening; associated with recent diagnosis of C. Difficile Colitis; has upcoming appointment with GI. Repeated AAS tonight without evidence of perforation or obstructive process   Agreeable to rx for hydrocodone liquid to manage pain; to ED for acutely worsening pain.   2. Clostridium Difficile Colitis: slowly improving; s/p admission for dehydration; appointment this week with GI.  Continue with BRAT diet, aggressive hydration.   Records reviewed in detail during visit.  Meds ordered this encounter  Medications  . HYDROcodone-acetaminophen (HYCET) 7.5-325 mg/15 ml solution    Sig: Take 15 mLs by mouth 4 (four) times daily as needed for pain.    Dispense:  120 mL    Refill:  0

## 2013-08-27 NOTE — Telephone Encounter (Signed)
Ok--1 rf. 

## 2013-08-30 ENCOUNTER — Ambulatory Visit: Payer: BC Managed Care – PPO | Admitting: Gastroenterology

## 2013-08-30 ENCOUNTER — Telehealth: Payer: Self-pay | Admitting: Gastroenterology

## 2013-08-30 ENCOUNTER — Telehealth: Payer: Self-pay

## 2013-08-30 ENCOUNTER — Encounter: Payer: Self-pay | Admitting: Gastroenterology

## 2013-08-30 NOTE — Telephone Encounter (Signed)
Pt had procedure scheduled with Dr Elnoria Howard and was he did a consult on the pt, is ok to schedule with you?

## 2013-08-30 NOTE — Telephone Encounter (Signed)
Pt came into office because she thought she had an appt today with Dr. Christella Hartigan. Chales Abrahams canceled the appt w/Dr. Christella Hartigan because she was suppose to have a colonoscopy with Dr. Elnoria Howard on 08-18-13. Advised pt she is a Dr. Elnoria Howard pt. She admitted she saw Dr. Elnoria Howard in the hospital and advised pt we would need all her GI records or she would need to contact Dr. Haywood Pao office for an appt. Because she has previous GI hx w/Dr. Elnoria Howard.  She was upset and left the office

## 2013-08-30 NOTE — Telephone Encounter (Signed)
[name] [facility] will call pt and get her scheduled

## 2013-08-30 NOTE — Telephone Encounter (Signed)
Please advise 

## 2013-08-30 NOTE — Telephone Encounter (Signed)
PT STATES SHE HAD A GI REFERRAL WHICH WAS CANCELLED, WOULD LIKE TO HAVE SOME MORE PAIN PILLS TO LAST UNTIL HER APPT, WHICH SHE ISN'T SURE WHEN IT IS PLEASE CALL [phone]  [facility] ON [address]

## 2013-08-30 NOTE — Telephone Encounter (Signed)
Pt originally had an appt on 08-30-13 w/Dr. Christella Hartigan. She could not wait until her appt, so she was seen in the [facility] ED and admitted. Dr. Elnoria Howard consulted in the hospital. Pt does not want to continue care with Dr. Elnoria Howard and is requesting to switch to Dr. Christella Hartigan because her original referral from her PCP was to Dr. Christella Hartigan

## 2013-08-30 NOTE — Telephone Encounter (Signed)
Happy to see her.  Thanks ?

## 2013-09-01 MED ORDER — HYDROCODONE-ACETAMINOPHEN 7.5-325 MG/15ML PO SOLN: 10.0000 mL | Freq: Four times a day (QID) | ORAL | Status: DC | PRN

## 2013-09-01 NOTE — Telephone Encounter (Signed)
OK to call in refill of hydrocodone to pharmacy.

## 2013-09-01 NOTE — Telephone Encounter (Signed)
Called in.

## 2013-09-05 ENCOUNTER — Other Ambulatory Visit: Payer: Self-pay | Admitting: Family Medicine

## 2013-09-07 NOTE — Telephone Encounter (Signed)
Please call in refill of hydrocodone for patient; also please call patient and have return to clinic in upcoming week for reevaluation. When is appointment with GI?

## 2013-09-08 NOTE — Telephone Encounter (Signed)
Called in Rx and [facility] for pt re: RF and also need to return next week for re-eval. Left her Dr Michaelle Copas schedule next week and advised her to RTC sooner if Sxs worsen over the weekend.  Dr Katrinka Blazing, pt's GI appt is 10/04/13.

## 2013-09-14 ENCOUNTER — Ambulatory Visit (INDEPENDENT_AMBULATORY_CARE_PROVIDER_SITE_OTHER): Payer: BC Managed Care – PPO | Admitting: Internal Medicine

## 2013-09-14 VITALS — BP 122/86 | HR 86 | Temp 98.2°F | Resp 20 | Ht 63.0 in | Wt 163.8 lb

## 2013-09-14 DIAGNOSIS — A0472 Enterocolitis due to Clostridium difficile, not specified as recurrent: Secondary | ICD-10-CM

## 2013-09-14 DIAGNOSIS — R109 Unspecified abdominal pain: Secondary | ICD-10-CM

## 2013-09-14 DIAGNOSIS — Z7189 Other specified counseling: Secondary | ICD-10-CM

## 2013-09-14 DIAGNOSIS — Z3042 Encounter for surveillance of injectable contraceptive: Secondary | ICD-10-CM

## 2013-09-14 DIAGNOSIS — Z3049 Encounter for surveillance of other contraceptives: Secondary | ICD-10-CM

## 2013-09-14 LAB — POCT CBC
Granulocyte percent: 47.5 %G (ref 37–80)
HCT, POC: 43.8 % (ref 37.7–47.9)
Lymph, poc: 4.2 — AB (ref 0.6–3.4)
MCHC: 32 g/dL (ref 31.8–35.4)
MCV: 103.1 fL — AB (ref 80–97)
MID (cbc): 0.8 (ref 0–0.9)
POC LYMPH PERCENT: 43.7 %L (ref 10–50)
Platelet Count, POC: 260 10*3/uL (ref 142–424)
RDW, POC: 13.7 %

## 2013-09-14 LAB — POCT SEDIMENTATION RATE: POCT SED RATE: 18 mm/hr (ref 0–22)

## 2013-09-14 LAB — POCT URINE PREGNANCY: Preg Test, Ur: NEGATIVE

## 2013-09-14 MED ORDER — MEDROXYPROGESTERONE ACETATE 150 MG/ML IM SUSP
150.0000 mg | Freq: Once | INTRAMUSCULAR | Status: AC
  Administered 2013-09-14: 150 mg via INTRAMUSCULAR

## 2013-09-14 MED ORDER — HYDROCODONE-ACETAMINOPHEN 7.5-325 MG/15ML PO SOLN: 15.0000 mL | Freq: Three times a day (TID) | ORAL | Status: DC | PRN

## 2013-09-14 NOTE — Patient Instructions (Signed)
Crohn's Disease Crohn's disease is a long-term (chronic) soreness and redness (inflammation) of the intestines (bowel). It can affect any portion of the digestive tract, from the mouth to the anus. It can also cause problems outside the digestive tract. Crohn's disease is closely related to a disease called ulcerative colitis (together, these two diseases are called inflammatory bowel disease).  CAUSES  The cause of Crohn's disease is not known. One theory is that, in an easily affected (susceptible) person, the immune system is triggered to attack the body's own digestive tissue. Crohn's disease runs in families. It seems to be more common in certain geographic areas and amongst certain races. There are no clear-cut dietary causes.  SYMPTOMS  Crohn's disease can cause many different symptoms since it can affect many different parts of the body. Symptoms include:  Fatigue.  Weight loss.  Chronic diarrhea, sometime bloody.  Abdominal pain and cramps.  Fever.  Ulcers or canker sores in the mouth or rectum.  Anemia (low red blood cells).  Arthritis, skin problems, and eye problems may occur. Complications of Crohn's disease can include:  Series of holes (perforation) of the bowel.  Portions of the intestines sticking to each other (adhesions).  Obstruction of the bowel.  Fistula formation, typically in the rectal area but also in other areas. A fistula is an opening between the bowels and the outside, or between the bowels and another organ.  A painful crack in the mucous membrane of the anus (rectal fissure). DIAGNOSIS  Your caregiver may suspect Crohn's disease based on your symptoms and an exam. Blood tests may confirm that there is a problem. You may be asked to submit a stool specimen for examination. X-rays and CT scans may be necessary. Ultimately, the diagnosis is usually made after a procedure that uses a flexible tube that is inserted via your mouth or your anus. This is done  under sedation and is called either an upper endoscopy or colonoscopy. With these tests, the specialist can take tiny tissue samples and remove them from the inside of the bowel (biopsy). Examination of this biopsy tissue under a microscope can reveal Crohn's disease as the cause of your symptoms. Due to the many different forms that Crohn's disease can take, symptoms may be present for several years before a diagnosis is made. HOME CARE INSTRUCTIONS   There is no cure for Crohn's disease. The best treatment is frequent checkups with your caregiver.  Symptoms such as diarrhea can be controlled with medications. Avoid foods that have a laxative effect such as fresh fruit, vegetables and dairy products. During flare ups, you can rest your bowel by refraining from solid foods. Drink clear liquids frequently during the day (electrolyte or re-hydrating fluids are best. Your caregiver can help you with suggestions). Drink often to prevent loss of body fluids (dehydration). When diarrhea has cleared, eat small meals and more frequently. Avoid food additives and stimulants such as caffeine (coffee, tea, or chocolate). Enzyme supplements may help if you develop intolerance to a sugar in dairy products (lactose). Ask your caregiver or dietitian about specific dietary instructions.  Try to maintain a positive attitude. Learn relaxation techniques such as self hypnosis, mental imaging, and muscle relaxation.  If possible, avoid stresses which can aggravate your condition.  Exercise regularly.  Follow your diet.  Always get plenty of rest. SEEK MEDICAL CARE IF:   Your symptoms fail to improve after a week or two of new treatment.  You experience continued weight loss.  You have   ongoing crampy digestion or loose bowels.  You develop a new skin rash, skin sores, or eye problems. SEEK IMMEDIATE MEDICAL CARE IF:   You have worsening of your symptoms or develop new symptoms.  You have a fever.  You  develop bloody diarrhea.  You develop severe abdominal pain. MAKE SURE YOU:   Understand these instructions.  Will watch your condition.  Will get help right away if you are not doing well or get worse. Document Released: 09/02/2005 Document Revised: 02/15/2012 Document Reviewed: 08/01/2007 [employer] Patient Information 2014 [employer].  

## 2013-09-14 NOTE — Progress Notes (Signed)
  Subjective:    Patient ID: [name], female    DOB: 1976-11-03, 37 y.o.   MRN: [id number]  HPI [name] presents today with abdominal pain. She has general pain around entire abdomen, but a stabbing sensation on the right side of pelvic region. She has diarrhea 4-8 times a day and blood with wiping with nausea. She states she has been taking Imodium which sometimes helps but was reccommended against. She was seen 2 weeks ago in which an appt was made for a GI specialist 10/04/13. She was informed to be seen for pain medication before her GI appt. She was seen at the ED at [facility] 09/17/13 in which she was admitted for a colonoscopy the next morning that didn't happen because she was diagnosed with C DIFF. She states she has chills but no fever associated. She knows she has crohns, she is on waiting list to get in sooner to GI. No fever, some chills  Review of Systems     Objective:   Physical Exam  Vitals reviewed. Constitutional: She is oriented to person, place, and time. She appears well-developed and well-nourished. She appears distressed.  HENT:  Head: Normocephalic.  Eyes: EOM are normal.  Cardiovascular: Normal rate.   Pulmonary/Chest: Effort normal.  Abdominal: She exhibits no distension. There is tenderness. There is guarding. There is no rebound.  Neurological: She is alert and oriented to person, place, and time. She exhibits normal muscle tone.  Psychiatric: She has a normal mood and affect. Her behavior is normal.   Results for orders placed in visit on 09/14/13  POCT URINE PREGNANCY      Result Value Range   Preg Test, Ur Negative     Results for orders placed in visit on 09/14/13  POCT URINE PREGNANCY      Result Value Range   Preg Test, Ur Negative    POCT CBC      Result Value Range   WBC 9.5  4.6 - 10.2 K/uL   Lymph, poc 4.2 (*) 0.6 - 3.4   POC LYMPH PERCENT 43.7  10 - 50 %L   MID (cbc) 0.8  0 - 0.9   POC MID % 8.8  0 - 12 %M   POC Granulocyte 4.5  2  - 6.9   Granulocyte percent 47.5  37 - 80 %G   RBC 4.25  4.04 - 5.48 M/uL   Hemoglobin 14.0  12.2 - 16.2 g/dL   HCT, POC 40.9  81.1 - 47.9 %   MCV 103.1 (*) 80 - 97 fL   MCH, POC 32.9 (*) 27 - 31.2 pg   MCHC 32.0  31.8 - 35.4 g/dL   RDW, POC 91.4     Platelet Count, POC 260  142 - 424 K/uL   MPV 8.9  0 - 99.8 fL          Assessment & Plan:  Abdominal pain/Colitis See GI sooner than 29th/First available RF Lortab

## 2013-09-14 NOTE — Progress Notes (Signed)
  Subjective:    Patient ID: [name], female    DOB: [birth year], 37 y.o.   MRN: [id number]  HPI    Review of Systems     Objective:   Physical Exam        Assessment & Plan:

## 2013-09-15 ENCOUNTER — Encounter: Payer: Self-pay | Admitting: Gastroenterology

## 2013-09-15 ENCOUNTER — Ambulatory Visit (INDEPENDENT_AMBULATORY_CARE_PROVIDER_SITE_OTHER): Payer: BC Managed Care – PPO | Admitting: Gastroenterology

## 2013-09-15 VITALS — BP 142/84 | HR 88 | Ht 63.5 in | Wt 162.0 lb

## 2013-09-15 DIAGNOSIS — K5190 Ulcerative colitis, unspecified, without complications: Secondary | ICD-10-CM

## 2013-09-15 DIAGNOSIS — R197 Diarrhea, unspecified: Secondary | ICD-10-CM

## 2013-09-15 DIAGNOSIS — K625 Hemorrhage of anus and rectum: Secondary | ICD-10-CM

## 2013-09-15 MED ORDER — MOVIPREP 100 G PO SOLR: 1.0000 | Freq: Once | ORAL | Status: DC

## 2013-09-15 NOTE — Patient Instructions (Signed)
You will be set up for a colonoscopy at [facility] with MAC sedation for your colitis on CT, diarrhea, bleeding.

## 2013-09-15 NOTE — Progress Notes (Signed)
 HPI: This is a    pleasant 37-year-old woman whom I am meeting for the first time today.  IMPRESSION from CT scan 08/17/13:  Diffuse colonic wall thickening from cecum to rectum compatible with pancolitis. Differential diagnosis includes ulcerative colitis, Crohn's disease, and infectious etiologies. Ischemia is felt unlikely due to age and distribution. Small probable uterine leiomyoma.  IMPRESSION from CT scan 08/21/13: There is less diffuse colonic wall thickening compared to recent prior study. There is still some diffuse colitis present, however. There is no bowel obstruction or abscess. Multiple subcentimeter lymph nodes are scattered throughout the mesenteric, probably reactive etiology given the colitis. Appendix region appears normal. Uterine leiomyoma, stable.  C. Diff positive by PCR 08/2013  Cbc, cmet  Normal 08/2013 except for slightly elevated wbc.  Cbc yesterday was normal  Starting 2 months ago, abdominal pain.  She was put on cipro at first, but never got better and so she went to ER.  Repeat CT scan performed.  Getting ready for   Was put on flagyl every 8 hours 2 weeks.  Her symptoms are severe abdominal pains.  Terrible diarrhea (4-8 times per day).  + nausea but no vomiting.     2 years ago had a large ovarian cyst.  In past week, she's been having 4-6 diarrheal stools per day.  Diarrhea was painful and bloody at times.  Still with nocturnal symptoms.    Always loose stools, sometimes bloody.    Review of systems: Pertinent positive and negative review of systems were noted in the above HPI section. Complete review of systems was performed and was otherwise normal.    Past Medical History  Diagnosis Date  . Ovarian cyst   . Hypertension   . Endometriosis   . C. difficile diarrhea     Sept 2014    Past Surgical History  Procedure Laterality Date  . Ovarian cyst removal      Current Outpatient Prescriptions  Medication Sig Dispense Refill  . B  Complex-C (B-COMPLEX WITH VITAMIN C) tablet Take 1 tablet by mouth daily.      . calcium carbonate (OS-CAL) 600 MG TABS tablet Take 600 mg by mouth daily with breakfast.      . clonazePAM (KLONOPIN) 1 MG tablet Take 1 tablet (1 mg total) by mouth 2 (two) times daily as needed for anxiety.  30 tablet  3  . EPINEPHrine (EPIPEN) 0.3 mg/0.3 mL DEVI Inject 0.3 mLs (0.3 mg total) into the muscle once.  2 Device  2  . HYDROcodone-acetaminophen (HYCET) 7.5-325 mg/15 ml solution Take 15 mLs by mouth every 8 (eight) hours as needed for pain.  240 mL  0  . medroxyPROGESTERone (DEPO-PROVERA) 150 MG/ML injection Inject 150 mg into the muscle every 3 (three) months.      . Multiple Vitamin (MULTIVITAMIN WITH MINERALS) TABS tablet Take 1 tablet by mouth daily.      . sertraline (ZOLOFT) 50 MG tablet Take 1 tablet (50 mg total) by mouth daily.  30 tablet  3   No current facility-administered medications for this visit.    Allergies as of 09/15/2013 - Review Complete 09/15/2013  Allergen Reaction Noted  . Bee venom Hives and Swelling 08/17/2013    Family History  Problem Relation Age of Onset  . Alzheimer's disease Mother   . Heart disease Father   . COPD Father     History   Social History  . Marital Status: Married    Spouse Name: N/A      Number of Children: N/A  . Years of Education: N/A   Occupational History  . Not on file.   Social History Main Topics  . Smoking status: Former Smoker -- 0.50 packs/day    Quit date: 02/04/2013  . Smokeless tobacco: Not on file  . Alcohol Use: 2.4 oz/week    4 Glasses of wine per week     Comment: "Good week 2 bottles of wine, bad week 4 bottles of wine"  . Drug Use: No  . Sexual Activity: Yes    Birth Control/ Protection: Injection, None   Other Topics Concern  . Not on file   Social History Narrative  . No narrative on file     Physical Exam: BP 142/84  Pulse 88  Ht 5' 3.5" (1.613 m)  Wt 162 lb (73.483 kg)  BMI 28.24  kg/m2 Constitutional: generally well-appearing Psychiatric: alert and oriented x3 Eyes: extraocular movements intact Mouth: oral pharynx moist, no lesions Neck: supple no lymphadenopathy Cardiovascular: heart regular rate and rhythm Lungs: clear to auscultation bilaterally Abdomen: soft, mildly tender throughout her abdomen, nondistended, no obvious ascites, no peritoneal signs, normal bowel sounds Extremities: no lower extremity edema bilaterally Skin: no lesions on visible extremities    Assessment and plan: 37 y.o. female with  abdominal pain, diarrhea which is intermittently bloody.  She had diffuse colonic thickening one month ago by CT scan. She was Clostridium difficile positive and that indeed may have been the cause of her colitis on CT scan in her symptoms. What argues against this is the fact that her white blood cell count was only very mildly elevated and she really didn't improve much with Flagyl. She does think her diarrhea may have improved slightly but not appreciably in her pain never really improved with the antibiotic. She has had 2 abdominal imaging tests, multiple lab tests. CBC yesterday showed a normal CBC actually. I think we need to proceed to colonoscopy to get a diagnosis here. She may indeed have Clostridium difficile, pseudomembranous colitis. Alternatively this might be inflammatory bowel disease presentation. I don't want to treat her with anything at this point since the diagnosis is still quite unclear. We'll try to expedite this for her.       

## 2013-09-18 ENCOUNTER — Telehealth: Payer: Self-pay

## 2013-09-18 ENCOUNTER — Encounter (HOSPITAL_COMMUNITY): Payer: Self-pay | Admitting: Pharmacy Technician

## 2013-09-18 ENCOUNTER — Encounter (HOSPITAL_COMMUNITY): Payer: Self-pay | Admitting: *Deleted

## 2013-09-18 NOTE — Telephone Encounter (Signed)
She should request this from her surgeon. Or return to clinic. Called her to advise.

## 2013-09-18 NOTE — Progress Notes (Signed)
Patient tells nurse has not been tested since admission 08/19/2013 for C.diff. and given IV's and abx for this. Still having diarrhea.

## 2013-09-18 NOTE — Telephone Encounter (Signed)
PT REQUESTING A REFILL ON HER HYDROCODONE. PLEASE CALL [phone]

## 2013-09-21 ENCOUNTER — Ambulatory Visit (HOSPITAL_COMMUNITY)
Admission: RE | Admit: 2013-09-21 | Discharge: 2013-09-21 | Disposition: A | Discharge status: Home / Self Care | Payer: BC Managed Care – PPO | Source: Ambulatory Visit | Attending: Gastroenterology | Admitting: Gastroenterology

## 2013-09-21 ENCOUNTER — Ambulatory Visit (HOSPITAL_COMMUNITY): Payer: BC Managed Care – PPO | Admitting: Certified Registered Nurse Anesthetist

## 2013-09-21 ENCOUNTER — Encounter (HOSPITAL_COMMUNITY): Payer: Self-pay

## 2013-09-21 ENCOUNTER — Encounter (HOSPITAL_COMMUNITY)
Admission: RE | Disposition: A | Discharge status: Home / Self Care | Payer: Self-pay | Source: Ambulatory Visit | Attending: Gastroenterology

## 2013-09-21 ENCOUNTER — Encounter (HOSPITAL_COMMUNITY): Payer: BC Managed Care – PPO | Admitting: Certified Registered Nurse Anesthetist

## 2013-09-21 DIAGNOSIS — R933 Abnormal findings on diagnostic imaging of other parts of digestive tract: Secondary | ICD-10-CM

## 2013-09-21 DIAGNOSIS — R197 Diarrhea, unspecified: Secondary | ICD-10-CM | POA: Insufficient documentation

## 2013-09-21 DIAGNOSIS — K625 Hemorrhage of anus and rectum: Secondary | ICD-10-CM

## 2013-09-21 DIAGNOSIS — R110 Nausea: Secondary | ICD-10-CM | POA: Insufficient documentation

## 2013-09-21 DIAGNOSIS — D259 Leiomyoma of uterus, unspecified: Secondary | ICD-10-CM | POA: Insufficient documentation

## 2013-09-21 DIAGNOSIS — K5100 Ulcerative (chronic) pancolitis without complications: Secondary | ICD-10-CM | POA: Insufficient documentation

## 2013-09-21 DIAGNOSIS — N83209 Unspecified ovarian cyst, unspecified side: Secondary | ICD-10-CM | POA: Insufficient documentation

## 2013-09-21 DIAGNOSIS — K5190 Ulcerative colitis, unspecified, without complications: Secondary | ICD-10-CM

## 2013-09-21 DIAGNOSIS — I10 Essential (primary) hypertension: Secondary | ICD-10-CM | POA: Insufficient documentation

## 2013-09-21 DIAGNOSIS — A0472 Enterocolitis due to Clostridium difficile, not specified as recurrent: Secondary | ICD-10-CM | POA: Insufficient documentation

## 2013-09-21 DIAGNOSIS — R109 Unspecified abdominal pain: Secondary | ICD-10-CM | POA: Insufficient documentation

## 2013-09-21 HISTORY — DX: Anxiety disorder, unspecified: F41.9

## 2013-09-21 HISTORY — PX: COLONOSCOPY WITH PROPOFOL: SHX5780

## 2013-09-21 HISTORY — DX: Major depressive disorder, single episode, unspecified: F32.9

## 2013-09-21 HISTORY — DX: Depression, unspecified: F32.A

## 2013-09-21 SURGERY — COLONOSCOPY WITH PROPOFOL
Anesthesia: Monitor Anesthesia Care

## 2013-09-21 MED ORDER — KETAMINE HCL 10 MG/ML IJ SOLN
INTRAMUSCULAR | Status: DC | PRN
  Administered 2013-09-21: 25 mg via INTRAVENOUS

## 2013-09-21 MED ORDER — FENTANYL CITRATE 0.05 MG/ML IJ SOLN
INTRAMUSCULAR | Status: DC | PRN
  Administered 2013-09-21: 50 ug via INTRAVENOUS
  Administered 2013-09-21: 100 ug via INTRAVENOUS

## 2013-09-21 MED ORDER — LACTATED RINGERS IV SOLN
INTRAVENOUS | Status: DC
  Administered 2013-09-21: 1000 mL via INTRAVENOUS

## 2013-09-21 MED ORDER — PROPOFOL 10 MG/ML IV BOLUS
INTRAVENOUS | Status: DC | PRN
  Administered 2013-09-21 (×2): 25 mg via INTRAVENOUS
  Administered 2013-09-21: 50 mg via INTRAVENOUS
  Administered 2013-09-21: 25 mg via INTRAVENOUS

## 2013-09-21 MED ORDER — SODIUM CHLORIDE 0.9 % IV SOLN: INTRAVENOUS | Status: DC

## 2013-09-21 MED ORDER — HYOSCYAMINE SULFATE ER 0.375 MG PO TB12: 0.3750 mg | ORAL_TABLET | Freq: Two times a day (BID) | ORAL | Status: DC

## 2013-09-21 MED ORDER — MIDAZOLAM HCL 5 MG/5ML IJ SOLN
INTRAMUSCULAR | Status: DC | PRN
  Administered 2013-09-21 (×2): 1 mg via INTRAVENOUS

## 2013-09-21 SURGICAL SUPPLY — 22 items

## 2013-09-21 NOTE — Preoperative (Addendum)
Beta Blockers   Reason not to administer Beta Blockers:Not Applicable 

## 2013-09-21 NOTE — Interval H&P Note (Signed)
History and Physical Interval Note:  09/21/2013 8:04 AM  [name]  has presented today for surgery, with the diagnosis of baseline   The various methods of treatment have been discussed with the patient and family. After consideration of risks, benefits and other options for treatment, the patient has consented to  Procedure(s): COLONOSCOPY WITH PROPOFOL (N/A) as a surgical intervention .  The patient's history has been reviewed, patient examined, no change in status, stable for surgery.  I have reviewed the patient's chart and labs.  Questions were answered to the patient's satisfaction.     Rachael Fee

## 2013-09-21 NOTE — Anesthesia Postprocedure Evaluation (Signed)
Anesthesia Post Note  Patient: [name]  Procedure(s) Performed: Procedure(s) (LRB): COLONOSCOPY WITH PROPOFOL (N/A)  Anesthesia type: MAC  Patient location: PACU  Post pain: Pain level controlled  Post assessment: Post-op Vital signs reviewed  Last Vitals:  Filed Vitals:   09/21/13 0930  BP: 130/84  Pulse:   Temp:   Resp: 20    Post vital signs: Reviewed  Level of consciousness: sedated  Complications: No apparent anesthesia complications

## 2013-09-21 NOTE — Op Note (Signed)
[facility] [address] [place], [address]   COLONOSCOPY PROCEDURE REPORT  PATIENT: Katherine, [name]  MR#: [id number] BIRTHDATE: [birth year] , 37  yrs. old GENDER: Female ENDOSCOPIST: Rachael Fee, MD PROCEDURE DATE:  09/21/2013 PROCEDURE:   Colonoscopy with biopsy First Screening Colonoscopy - Avg.  risk and is 50 yrs.  old or older - No.  Prior Negative Screening - Now for repeat screening. N/A  History of Adenoma - Now for follow-up colonoscopy & has been > or = to 3 yrs.  N/A  Polyps Removed Today? No.  Recommend repeat exam, <10 yrs? No. ASA CLASS:   Class III INDICATIONS:admitted with abd pain, diarrhea; CT scan showed pan-colitis; C.  diff by PCR positive, symptoms improved but did not resolve completely with flagyl course, still with abd pain. MEDICATIONS: MAC sedation, administered by CRNA  DESCRIPTION OF PROCEDURE:   After the risks benefits and alternatives of the procedure were thoroughly explained, informed consent was obtained.  A digital rectal exam revealed no abnormalities of the rectum.   The Pentax Ped Colon [id number] endoscope was introduced through the anus and advanced to the terminal ileum which was intubated for a short distance. No adverse events experienced.   The quality of the prep was good.  The instrument was then slowly withdrawn as the colon was fully examined.  COLON FINDINGS: The terminal ileum was normal.  The mucosa throughout the colon was essentially normal except for mild edem in left colon without erythema or overt inflammation.  The colon was sampled randomly (right colon, left colon) with biopsy.  The examination was otherwise normal.  Retroflexed views revealed no abnormalities. The time to cecum=3 minutes 00 seconds.  Withdrawal time=10 minutes 00 seconds.  The scope was withdrawn and the procedure completed. COMPLICATIONS: There were no complications.  ENDOSCOPIC IMPRESSION: The terminal ileum was normal.  The  mucosa throughout the colon was essentially normal except for mild edem in left colon without erythema or overt inflammation.  The colon was sampled randomly (right colon, left colon) with biopsy.  The examination was otherwise normal.  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  Await pathology for further recommendations.   eSigned:  Rachael Fee, MD 09/21/2013 8:45 AM

## 2013-09-21 NOTE — Anesthesia Preprocedure Evaluation (Addendum)
Anesthesia Evaluation  Patient identified by MRN, date of birth, ID band Patient awake    Reviewed: Allergy & Precautions, H&P , NPO status , Patient's Chart, lab work & pertinent test results  Airway Mallampati: II TM Distance: >3 FB Neck ROM: Full    Dental  (+) Poor Dentition, Missing and Dental Advisory Given   Pulmonary neg pulmonary ROS, former smoker,  breath sounds clear to auscultation  Pulmonary exam normal       Cardiovascular hypertension, Rhythm:Regular Rate:Normal     Neuro/Psych Anxiety Depression negative neurological ROS  negative psych ROS   GI/Hepatic negative GI ROS, Neg liver ROS,   Endo/Other  negative endocrine ROS  Renal/GU negative Renal ROS  negative genitourinary   Musculoskeletal negative musculoskeletal ROS (+)   Abdominal   Peds  Hematology negative hematology ROS (+)   Anesthesia Other Findings   Reproductive/Obstetrics negative OB ROS                          Anesthesia Physical Anesthesia Plan  ASA: II  Anesthesia Plan: MAC   Post-op Pain Management:    Induction: Intravenous  Airway Management Planned: Simple Face Mask  Additional Equipment:   Intra-op Plan:   Post-operative Plan:   Informed Consent: I have reviewed the patients History and Physical, chart, labs and discussed the procedure including the risks, benefits and alternatives for the proposed anesthesia with the patient or authorized representative who has indicated his/her understanding and acceptance.   Dental advisory given  Plan Discussed with: CRNA  Anesthesia Plan Comments:         Anesthesia Quick Evaluation

## 2013-09-21 NOTE — Transfer of Care (Signed)
Immediate Anesthesia Transfer of Care Note  Patient: [name]  Procedure(s) Performed: Procedure(s): COLONOSCOPY WITH PROPOFOL (N/A)  Patient Location: PACU and Endoscopy Unit  Anesthesia Type:MAC  Level of Consciousness: awake, alert , oriented and patient cooperative  Airway & Oxygen Therapy: Patient Spontanous Breathing and Patient connected to face mask oxygen  Post-op Assessment: Report given to PACU RN, Post -op Vital signs reviewed and stable and Patient moving all extremities  Post vital signs: Reviewed and stable  Complications: No apparent anesthesia complications

## 2013-09-21 NOTE — H&P (View-Only) (Signed)
HPI: This is a    pleasant 37 year old woman whom I am meeting for the first time today.  IMPRESSION from CT scan 08/17/13:  Diffuse colonic wall thickening from cecum to rectum compatible with pancolitis. Differential diagnosis includes ulcerative colitis, Crohn's disease, and infectious etiologies. Ischemia is felt unlikely due to age and distribution. Small probable uterine leiomyoma.  IMPRESSION from CT scan 08/21/13: There is less diffuse colonic wall thickening compared to recent prior study. There is still some diffuse colitis present, however. There is no bowel obstruction or abscess. Multiple subcentimeter lymph nodes are scattered throughout the mesenteric, probably reactive etiology given the colitis. Appendix region appears normal. Uterine leiomyoma, stable.  C. Diff positive by PCR 08/2013  Cbc, cmet  Normal 08/2013 except for slightly elevated wbc.  Cbc yesterday was normal  Starting 2 months ago, abdominal pain.  She was put on cipro at first, but never got better and so she went to ER.  Repeat CT scan performed.  Getting ready for   Was put on flagyl every 8 hours 2 weeks.  Her symptoms are severe abdominal pains.  Terrible diarrhea (4-8 times per day).  + nausea but no vomiting.     2 years ago had a large ovarian cyst.  In past week, she's been having 4-6 diarrheal stools per day.  Diarrhea was painful and bloody at times.  Still with nocturnal symptoms.    Always loose stools, sometimes bloody.    Review of systems: Pertinent positive and negative review of systems were noted in the above HPI section. Complete review of systems was performed and was otherwise normal.    Past Medical History  Diagnosis Date  . Ovarian cyst   . Hypertension   . Endometriosis   . C. difficile diarrhea     Sept 2014    Past Surgical History  Procedure Laterality Date  . Ovarian cyst removal      Current Outpatient Prescriptions  Medication Sig Dispense Refill  . B  Complex-C (B-COMPLEX WITH VITAMIN C) tablet Take 1 tablet by mouth daily.      . calcium carbonate (OS-CAL) 600 MG TABS tablet Take 600 mg by mouth daily with breakfast.      . clonazePAM (KLONOPIN) 1 MG tablet Take 1 tablet (1 mg total) by mouth 2 (two) times daily as needed for anxiety.  30 tablet  3  . EPINEPHrine (EPIPEN) 0.3 mg/0.3 mL DEVI Inject 0.3 mLs (0.3 mg total) into the muscle once.  2 Device  2  . HYDROcodone-acetaminophen (HYCET) 7.5-325 mg/15 ml solution Take 15 mLs by mouth every 8 (eight) hours as needed for pain.  240 mL  0  . medroxyPROGESTERone (DEPO-PROVERA) 150 MG/ML injection Inject 150 mg into the muscle every 3 (three) months.      . Multiple Vitamin (MULTIVITAMIN WITH MINERALS) TABS tablet Take 1 tablet by mouth daily.      . sertraline (ZOLOFT) 50 MG tablet Take 1 tablet (50 mg total) by mouth daily.  30 tablet  3   No current facility-administered medications for this visit.    Allergies as of 09/15/2013 - Review Complete 09/15/2013  Allergen Reaction Noted  . Bee venom Hives and Swelling 08/17/2013    Family History  Problem Relation Age of Onset  . Alzheimer's disease Mother   . Heart disease Father   . COPD Father     History   Social History  . Marital Status: Married    Spouse Name: N/A  Number of Children: N/A  . Years of Education: N/A   Occupational History  . Not on file.   Social History Main Topics  . Smoking status: Former Smoker -- 0.50 packs/day    Quit date: 02/04/2013  . Smokeless tobacco: Not on file  . Alcohol Use: 2.4 oz/week    4 Glasses of wine per week     Comment: "Good week 2 bottles of wine, bad week 4 bottles of wine"  . Drug Use: No  . Sexual Activity: Yes    Birth Control/ Protection: Injection, None   Other Topics Concern  . Not on file   Social History Narrative  . No narrative on file     Physical Exam: BP 142/84  Pulse 88  Ht 5' 3.5" (1.613 m)  Wt 162 lb (73.483 kg)  BMI 28.24  kg/m2 Constitutional: generally well-appearing Psychiatric: alert and oriented x3 Eyes: extraocular movements intact Mouth: oral pharynx moist, no lesions Neck: supple no lymphadenopathy Cardiovascular: heart regular rate and rhythm Lungs: clear to auscultation bilaterally Abdomen: soft, mildly tender throughout her abdomen, nondistended, no obvious ascites, no peritoneal signs, normal bowel sounds Extremities: no lower extremity edema bilaterally Skin: no lesions on visible extremities    Assessment and plan: 37 y.o. female with  abdominal pain, diarrhea which is intermittently bloody.  She had diffuse colonic thickening one month ago by CT scan. She was Clostridium difficile positive and that indeed may have been the cause of her colitis on CT scan in her symptoms. What argues against this is the fact that her white blood cell count was only very mildly elevated and she really didn't improve much with Flagyl. She does think her diarrhea may have improved slightly but not appreciably in her pain never really improved with the antibiotic. She has had 2 abdominal imaging tests, multiple lab tests. CBC yesterday showed a normal CBC actually. I think we need to proceed to colonoscopy to get a diagnosis here. She may indeed have Clostridium difficile, pseudomembranous colitis. Alternatively this might be inflammatory bowel disease presentation. I don't want to treat her with anything at this point since the diagnosis is still quite unclear. We'll try to expedite this for her.

## 2013-09-22 ENCOUNTER — Encounter (HOSPITAL_COMMUNITY): Payer: Self-pay | Admitting: Gastroenterology

## 2013-09-26 ENCOUNTER — Telehealth: Payer: Self-pay | Admitting: *Deleted

## 2013-09-26 DIAGNOSIS — K625 Hemorrhage of anus and rectum: Secondary | ICD-10-CM

## 2013-09-26 MED ORDER — HYOSCYAMINE SULFATE 0.125 MG SL SUBL: 0.1250 mg | SUBLINGUAL_TABLET | SUBLINGUAL | Status: DC | PRN

## 2013-09-26 NOTE — Telephone Encounter (Signed)
Hyoscyamine works but has occasionally seen in the toilet after a bowel movement the "whole pill" .  Dr Christella Hartigan is out of the office until Monday, I consulted with Dr Jarold Motto and he verbalized the ok to send in Hyoscyamine sublingual.  Pt will try this and call back if this does not work  Forwarded to Dr Christella Hartigan for review

## 2013-09-26 NOTE — Telephone Encounter (Signed)
Left message on machine to call back  

## 2013-10-01 NOTE — Telephone Encounter (Signed)
OK to see the pill, I agree

## 2013-10-03 NOTE — Telephone Encounter (Signed)
Her colon didn't look very bad 2 weeks ago and biopsies showed no inflammation.  Unclear what her severe pain is from, also not sure what is causing the bleeding.    She needs rov with me tomorrow, double book if needed.  CBC, CMET in early AM so they are available by time she is seen.

## 2013-10-03 NOTE — Telephone Encounter (Signed)
Pt states she has diarrhea that started today and it was a large amount of bright red blood in toilet.  Also, abd pain and rectal pain.  No fever.  Nausea but no vomiting.  She has been on liquids only.  Please advise

## 2013-10-03 NOTE — Telephone Encounter (Signed)
Pt aware and appt has been scheduled as well as labs

## 2013-10-04 ENCOUNTER — Encounter: Payer: Self-pay | Admitting: Gastroenterology

## 2013-10-04 ENCOUNTER — Ambulatory Visit: Payer: BC Managed Care – PPO | Admitting: Gastroenterology

## 2013-10-04 ENCOUNTER — Other Ambulatory Visit (INDEPENDENT_AMBULATORY_CARE_PROVIDER_SITE_OTHER): Payer: BC Managed Care – PPO

## 2013-10-04 ENCOUNTER — Ambulatory Visit (INDEPENDENT_AMBULATORY_CARE_PROVIDER_SITE_OTHER): Payer: BC Managed Care – PPO | Admitting: Gastroenterology

## 2013-10-04 VITALS — BP 100/70 | HR 76 | Ht 63.5 in | Wt 164.5 lb

## 2013-10-04 DIAGNOSIS — R195 Other fecal abnormalities: Secondary | ICD-10-CM

## 2013-10-04 DIAGNOSIS — K625 Hemorrhage of anus and rectum: Secondary | ICD-10-CM

## 2013-10-04 LAB — COMPREHENSIVE METABOLIC PANEL
ALT: 29 U/L (ref 0–35)
Albumin: 4.1 g/dL (ref 3.5–5.2)
CO2: 23 mEq/L (ref 19–32)
GFR: 78.31 mL/min (ref >60.00)
Glucose, Bld: 105 mg/dL — ABNORMAL HIGH (ref 70–99)
Potassium: 4.2 mEq/L (ref 3.5–5.1)
Sodium: 139 mEq/L (ref 135–145)
Total Protein: 7.6 g/dL (ref 6.0–8.3)

## 2013-10-04 LAB — CBC WITH DIFFERENTIAL/PLATELET
Basophils Absolute: 0.1 10*3/uL (ref 0.0–0.1)
Basophils Relative: 1 % (ref 0.0–3.0)
Eosinophils Relative: 2.9 % (ref 0.0–5.0)
HCT: 41.1 % (ref 36.0–46.0)
Lymphocytes Relative: 47.8 % — ABNORMAL HIGH (ref 12.0–46.0)
Lymphs Abs: 4.1 10*3/uL — ABNORMAL HIGH (ref 0.7–4.0)
Monocytes Relative: 8 % (ref 3.0–12.0)
Neutrophils Relative %: 40.3 % — ABNORMAL LOW (ref 43.0–77.0)
Platelets: 273 10*3/uL (ref 150.0–400.0)
WBC: 8.6 10*3/uL (ref 4.5–10.5)

## 2013-10-04 MED ORDER — HYDROCORTISONE ACE-PRAMOXINE 2.5-1 % RE CREA: TOPICAL_CREAM | Freq: Two times a day (BID) | RECTAL | Status: DC

## 2013-10-04 MED ORDER — METRONIDAZOLE 250 MG PO TABS: 250.0000 mg | ORAL_TABLET | Freq: Three times a day (TID) | ORAL | Status: DC

## 2013-10-04 NOTE — Progress Notes (Signed)
Review of pertinent gastrointestinal problems: 1. abd pain, anormal colon on CT, d. ciff + : IMPRESSION from CT scan 08/17/13: Diffuse colonic wall thickening from cecum to rectum compatible with pancolitis. Differential diagnosis includes ulcerative colitis, Crohn's disease, and infectious etiologies. Ischemia is felt unlikely due to age and distribution. Small probable uterine leiomyoma. IMPRESSION from CT scan 08/21/13: There is less diffuse colonic wall thickening compared to recent prior study. There is still some diffuse colitis present, however. There is no bowel obstruction or abscess. Multiple subcentimeter lymph nodes are scattered throughout the mesenteric, probably reactive etiology given the colitis. Appendix region appears normal. Uterine leiomyoma, stable. C. Diff positive by PCR 08/2013 Cbc, cmet Normal 08/2013 except for slightly elevated wbc.  Colonoscopy 09/2013 Christella Hartigan showed slightly edematous mucosa in colon, biopsies were all normal however. TI was normal. She was put on antispasmodics (?post infectious, post C. Diff IBS?).   HPI: This is a   pleasant 37 year old woman whom I last saw a couple weeks ago for colonoscopy and a week prior to that in our office. See all his results summarized above.  Still having diarrhea, pain in abdomen.  SHe is eating poorly, living off gatorade, not eating.  She has gained 2 pounds since visit 3 weeks ago in office.  She has been having ano-rectal pain.  Severe.  Diarrhea again 3-4 times per day, loose stools.     CBC, CMET today were both normal;   Past Medical History  Diagnosis Date  . Ovarian cyst   . Hypertension   . Endometriosis   . C. difficile diarrhea     Sept 2014  . Anxiety   . Depression     Past Surgical History  Procedure Laterality Date  . Ovarian cyst removal    . Colonoscopy with propofol N/A 09/21/2013    Procedure: COLONOSCOPY WITH PROPOFOL;  Surgeon: Rachael Fee, MD;  Location: [facility] ENDOSCOPY;  Service:  Endoscopy;  Laterality: N/A;    Current Outpatient Prescriptions  Medication Sig Dispense Refill  . B Complex-C (B-COMPLEX WITH VITAMIN C) tablet Take 1 tablet by mouth daily.      . calcium carbonate (OS-CAL) 600 MG TABS tablet Take 600 mg by mouth daily with breakfast.      . clonazePAM (KLONOPIN) 1 MG tablet Take 1 mg by mouth 2 (two) times daily as needed for anxiety.      Marland Kitchen EPINEPHrine (EPIPEN) 0.3 mg/0.3 mL SOAJ injection Inject into the muscle once as needed (allergic reaction).      . hyoscyamine (LEVSIN SL) 0.125 MG SL tablet Place 1 tablet (0.125 mg total) under the tongue every 4 (four) hours as needed for cramping.  30 tablet  0  . ibuprofen (ADVIL,MOTRIN) 200 MG tablet Take 600 mg by mouth every 6 (six) hours as needed for pain.      . medroxyPROGESTERone (DEPO-PROVERA) 150 MG/ML injection Inject 150 mg into the muscle every 3 (three) months.      . Multiple Vitamin (MULTIVITAMIN WITH MINERALS) TABS tablet Take 1 tablet by mouth daily.      . sertraline (ZOLOFT) 50 MG tablet Take 50 mg by mouth every evening.       No current facility-administered medications for this visit.    Allergies as of 10/04/2013 - Review Complete 10/04/2013  Allergen Reaction Noted  . Bee venom Hives and Swelling 08/17/2013    Family History  Problem Relation Age of Onset  . Alzheimer's disease Mother   . Heart disease Father   .  COPD Father     History   Social History  . Marital Status: Married    Spouse Name: N/A    Number of Children: N/A  . Years of Education: N/A   Occupational History  . Not on file.   Social History Main Topics  . Smoking status: Former Smoker -- 0.50 packs/day    Types: Cigarettes    Quit date: 02/04/2013  . Smokeless tobacco: Never Used  . Alcohol Use: 2.4 oz/week    4 Glasses of wine per week     Comment: "Good week 2 bottles of wine, bad week 4 bottles of wine"-TAKES 2 GLASSES WINE NIGHTLY  . Drug Use: No  . Sexual Activity: Yes    Birth Control/  Protection: Injection, None   Other Topics Concern  . Not on file   Social History Narrative  . No narrative on file      Physical Exam: BP 100/70  Pulse 76  Ht 5' 3.5" (1.613 m)  Wt 164 lb 8 oz (74.617 kg)  BMI 28.68 kg/m2 Constitutional: generally well-appearing, very malodorous body odor Psychiatric: alert and oriented x3 Abdomen: soft, nontender, nondistended, no obvious ascites, no peritoneal signs, normal bowel sounds Anorectal examination with female assistant in the room: Small, nonthrombosed external anal hemorrhoids, no clear anal fissure, digital rectal exam elicited some pain a bit more than usual. No clear perirectal abscess, no fluctuance.    Assessment and plan: 37 y.o. female with persistent loose stools, anal discomfort  She was Clostridium difficile positive about a month ago and perhaps that has recurred. Her colonoscopy found no pseudomembranes 2 weeks ago but no selective retreat with Flagyl given her persistent, recurrent diarrhea. 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 She'll start taking a single Imodium every morning shortly after waking up. I have also given her Analpram cream for her small hemorrhoids and her anal discomfort. She will return to see me in 4 weeks and sooner if needed.

## 2013-10-04 NOTE — Patient Instructions (Signed)
Will retreat C. Diff, empirically, with flagyl 250mg  po three times daily for 10 days. Anal care with analpram, apply twice daily for anal pain. Please return to see Dr. Christella Hartigan in 4 weeks. Start one imodium pill every morning for loose stools.

## 2013-10-11 ENCOUNTER — Other Ambulatory Visit: Payer: Self-pay | Admitting: Internal Medicine

## 2013-10-13 NOTE — Telephone Encounter (Signed)
rtc 

## 2014-07-07 ENCOUNTER — Ambulatory Visit (HOSPITAL_COMMUNITY)
Admission: RE | Admit: 2014-07-07 | Discharge: 2014-07-07 | Disposition: A | Discharge status: Home / Self Care | Payer: BC Managed Care – PPO | Source: Ambulatory Visit | Attending: Family Medicine | Admitting: Family Medicine

## 2014-07-07 ENCOUNTER — Ambulatory Visit (INDEPENDENT_AMBULATORY_CARE_PROVIDER_SITE_OTHER): Payer: BC Managed Care – PPO | Admitting: Family Medicine

## 2014-07-07 VITALS — BP 130/90 | HR 117 | Temp 98.6°F | Resp 16 | Ht 63.5 in | Wt 162.0 lb

## 2014-07-07 DIAGNOSIS — R259 Unspecified abnormal involuntary movements: Secondary | ICD-10-CM

## 2014-07-07 DIAGNOSIS — E860 Dehydration: Secondary | ICD-10-CM

## 2014-07-07 DIAGNOSIS — R252 Cramp and spasm: Secondary | ICD-10-CM

## 2014-07-07 DIAGNOSIS — R599 Enlarged lymph nodes, unspecified: Secondary | ICD-10-CM

## 2014-07-07 DIAGNOSIS — M542 Cervicalgia: Secondary | ICD-10-CM

## 2014-07-07 DIAGNOSIS — R591 Generalized enlarged lymph nodes: Secondary | ICD-10-CM

## 2014-07-07 DIAGNOSIS — D72829 Elevated white blood cell count, unspecified: Secondary | ICD-10-CM

## 2014-07-07 LAB — POCT CBC
Granulocyte percent: 61.5 %G (ref 37–80)
HEMATOCRIT: 53.9 % — AB (ref 37.7–47.9)
Hemoglobin: 17.5 g/dL — AB (ref 12.2–16.2)
LYMPH, POC: 4.1 — AB (ref 0.6–3.4)
MCH, POC: 32.8 pg — AB (ref 27–31.2)
MCHC: 32.5 g/dL (ref 31.8–35.4)
MCV: 100.9 fL — AB (ref 80–97)
MID (CBC): 0.9 (ref 0–0.9)
MPV: 8.3 fL (ref 0–99.8)
POC GRANULOCYTE: 7.9 — AB (ref 2–6.9)
POC LYMPH PERCENT: 31.5 %L (ref 10–50)
POC MID %: 7 %M (ref 0–12)
Platelet Count, POC: 250 10*3/uL (ref 142–424)
RBC: 5.34 M/uL (ref 4.04–5.48)
RDW, POC: 14 %
WBC: 12.9 10*3/uL — AB (ref 4.6–10.2)

## 2014-07-07 LAB — POCT SEDIMENTATION RATE: POCT SED RATE: 2 mm/hr (ref 0–22)

## 2014-07-07 MED ORDER — IOHEXOL 300 MG/ML  SOLN
100.0000 mL | Freq: Once | INTRAMUSCULAR | Status: AC | PRN
  Administered 2014-07-07: 100 mL via INTRAVENOUS

## 2014-07-07 MED ORDER — DOXYCYCLINE HYCLATE 100 MG PO CAPS: 100.0000 mg | ORAL_CAPSULE | Freq: Two times a day (BID) | ORAL | Status: DC

## 2014-07-07 MED ORDER — OXYCODONE-ACETAMINOPHEN 5-325 MG PO TABS: 1.0000 | ORAL_TABLET | Freq: Three times a day (TID) | ORAL | Status: DC | PRN

## 2014-07-07 NOTE — Progress Notes (Deleted)
 Subjective:    Patient ID: [name], female    DOB: 01/31/1976, 38 y.o.   MRN: [id number] This chart was scribed for Eva N Shaw, MD by Jordan Peace, ED Scribe. The patient was seen in [facility]. The patient's care was started at 3:21 PM.  Chief Complaint  Patient presents with  . Mass    behind left ear causing pain and stiiffness in jaw started this am     HPI HPI Comments: [name] is a 38 y.o. female who presents to the [facility] complaining of bump on the posterior aspect of her neck that she noticed this morning when she woke up. she states is painful and makes it hard for her to move her neck or jaw. Pt states that she took IBU this morning when she woke up to address pain without much relief. Pt reports that she was outside last night at a bonfire and figured she might've gotten bitten by a bug. She goes on to report that her husband checked for any bites on her skin and did not see any. She denies any fever or chills. She further denies any history of similar occurrences.  She denies any puss or metallic taste in her mouth.  Past Medical History  Diagnosis Date  . Ovarian cyst   . Hypertension   . Endometriosis   . C. difficile diarrhea     Sept 2014  . Anxiety   . Depression    Current Outpatient Prescriptions on File Prior to Visit  Medication Sig Dispense Refill  . B Complex-C (B-COMPLEX WITH VITAMIN C) tablet Take 1 tablet by mouth daily.      . calcium carbonate (OS-CAL) 600 MG TABS tablet Take 600 mg by mouth daily with breakfast.      . clonazePAM (KLONOPIN) 1 MG tablet Take 1 mg by mouth 2 (two) times daily as needed for anxiety.      . EPINEPHrine (EPIPEN) 0.3 mg/0.3 mL SOAJ injection Inject into the muscle once as needed (allergic reaction).      . hydrocortisone-pramoxine (ANALPRAM-HC) 2.5-1 % rectal cream Place rectally 2 (two) times daily.  30 g  3  . hyoscyamine (LEVSIN SL) 0.125 MG SL tablet Place 1 tablet (0.125 mg total) under the tongue every 4 (four) hours  as needed for cramping.  30 tablet  0  . medroxyPROGESTERone (DEPO-PROVERA) 150 MG/ML injection Inject 150 mg into the muscle every 3 (three) months.      . Multiple Vitamin (MULTIVITAMIN WITH MINERALS) TABS tablet Take 1 tablet by mouth daily.      . sertraline (ZOLOFT) 50 MG tablet Take 50 mg by mouth every evening.       No current facility-administered medications on file prior to visit.   Allergies  Allergen Reactions  . Bee Venom Hives and Swelling       Review of Systems  Constitutional: Negative for fever and chills.  Respiratory: Negative for cough and shortness of breath.   Gastrointestinal: Negative for nausea, vomiting, abdominal pain and diarrhea.  Genitourinary: Negative for dysuria, urgency, frequency, hematuria and difficulty urinating.  Skin: Positive for color change.  Psychiatric/Behavioral: Negative for confusion.       Objective:   Physical Exam  Nursing note and vitals reviewed. Constitutional: She is oriented to person, place, and time. She appears well-developed and well-nourished. No distress.  HENT:  Head: Normocephalic and atraumatic.  Right Ear: External ear normal.  Left Ear: External ear normal.  Nose: Nose normal.  Mouth/Throat:   Uvula is midline. Mucous membranes are not pale, dry and not cyanotic. No oral lesions. No trismus in the jaw. No uvula swelling. Posterior oropharyngeal erythema present. No posterior oropharyngeal edema.  Nasal mucus and rhinorrhea.   Eyes: Conjunctivae and EOM are normal.  Neck: Neck supple. No tracheal deviation present.  posterior cervicular adenopathy with tenderness over mastoid bone no submandibular adenopathy no supercervicular adenopathy.  postive adenopathy in postcervicular chain.    Cardiovascular: Normal rate, regular rhythm, normal heart sounds and intact distal pulses.   No murmur heard. Pulmonary/Chest: Effort normal. No respiratory distress.  Musculoskeletal: Normal range of motion.    Lymphadenopathy:    She has cervical adenopathy.  Neurological: She is alert and oriented to person, place, and time.  Skin: Skin is warm and dry. There is erythema (mild).  Psychiatric: She has a normal mood and affect. Her behavior is normal.      Results for orders placed in visit on 07/07/14  POCT CBC      Result Value Ref Range   WBC 12.9 (*) 4.6 - 10.2 K/uL   Lymph, poc 4.1 (*) 0.6 - 3.4   POC LYMPH PERCENT 31.5  10 - 50 %L   MID (cbc) 0.9  0 - 0.9   POC MID % 7.0  0 - 12 %M   POC Granulocyte 7.9 (*) 2 - 6.9   Granulocyte percent 61.5  37 - 80 %G   RBC 5.34  4.04 - 5.48 M/uL   Hemoglobin 17.5 (*) 12.2 - 16.2 g/dL   HCT, POC 53.9 (*) 37.7 - 47.9 %   MCV 100.9 (*) 80 - 97 fL   MCH, POC 32.8 (*) 27 - 31.2 pg   MCHC 32.5  31.8 - 35.4 g/dL   RDW, POC 14.0     Platelet Count, POC 250  142 - 424 K/uL   MPV 8.3  0 - 99.8 fL  POCT SEDIMENTATION RATE      Result Value Ref Range   POCT SED RATE 2  0 - 22 mm/hr    Assessment & Plan:  3:26 PM- Treatment plan was discussed with patient who verbalizes understanding and agrees.   3:57 PM- Will send pt over to have CT scan performed to rule parotiditis versus mastoiditis or any other deep space neck infections due to the severity of pain and distress on exam.  ADDENDUM: CT normal. OK to start doxy, push fluids, recheck in clinic in 2d to ensure improving. If worsening at that time, consider additional labs such as repeating esr/cbc, HIV, EBV, CXR, etc  Adenopathy - Plan: POCT CBC, POCT SEDIMENTATION RATE, CT Head Wo Contrast, CT Soft Tissue Neck W Contrast, POCT glucose (manual entry), CANCELED: CT Soft Tissue Neck Wo Contrast  Neck pain on left side - Plan: POCT glucose (manual entry)  Dehydration  Trismus  Leukocytosis, unspecified  Meds ordered this encounter  Medications  . doxycycline (VIBRAMYCIN) 100 MG capsule    Sig: Take 1 capsule (100 mg total) by mouth 2 (two) times daily.    Dispense:  20 capsule    Refill:  0   . oxyCODONE-acetaminophen (ROXICET) 5-325 MG per tablet    Sig: Take 1 tablet by mouth every 8 (eight) hours as needed for severe pain.    Dispense:  20 tablet    Refill:  0    I personally performed the services described in this documentation, which was scribed in my presence. The recorded information has been reviewed and considered, and   addended by me as needed.  Eva Shaw, MD MPH   EXAM: CT HEAD WITHOUT CONTRAST  TECHNIQUE: Contiguous axial images were obtained from the base of the skull through the vertex without intravenous contrast.  COMPARISON: None.  FINDINGS: The bony calvarium is intact. No gross soft tissue abnormality is seen. No findings to suggest acute hemorrhage, acute infarction or space-occupying mass lesion are noted.  IMPRESSION: No acute intracranial abnormality noted.   EXAM: CT NECK WITH CONTRAST  TECHNIQUE: Multidetector CT imaging of the neck was performed using the standard protocol following the bolus administration of intravenous contrast.  CONTRAST: 100mL OMNIPAQUE IOHEXOL 300 MG/ML SOLN  COMPARISON: None.  FINDINGS: A metallic marker is marking the area of concern posterior to the left ear. At that location, there is a small enhancing mass immediately adjacent to the underlying muscle. This mass has an appearance similar to normal lymph nodes elsewhere in the neck. The mass measures 11 x 4 mm in maximum dimensions on image number 53 and 9 mm in AP diameter on axial image 16. The remainder of the examination is unremarkable.  IMPRESSION: Small reactive lymph node posterior to the left ear, corresponding to the palpable mass  

## 2014-07-09 NOTE — Progress Notes (Signed)
Subjective:    Patient ID: [name], female    DOB: 08-17-1976, 38 y.o.   MRN: [id number] This chart was scribed for Katherine Knapp, MD by [place]ique Peace, ED Scribe. The patient was seen in [facility]. The patient's care was started at 3:21 PM.  Chief Complaint  Patient presents with  . Mass    behind left ear causing pain and stiiffness in jaw started this am     HPI HPI Comments: [name] is a 38 y.o. female who presents to the [facility] complaining of bump on the posterior aspect of her neck that she noticed this morning when she woke up. she states is painful and makes it hard for her to move her neck or jaw. Pt states that she took IBU this morning when she woke up to address pain without much relief. Pt reports that she was outside last night at a bonfire and figured she might've gotten bitten by a bug. She goes on to report that her husband checked for any bites on her skin and did not see any. She denies any fever or chills. She further denies any history of similar occurrences.  She denies any puss or metallic taste in her mouth.  Past Medical History  Diagnosis Date  . Ovarian cyst   . Hypertension   . Endometriosis   . C. difficile diarrhea     Sept 2014  . Anxiety   . Depression    Current Outpatient Prescriptions on File Prior to Visit  Medication Sig Dispense Refill  . B Complex-C (B-COMPLEX WITH VITAMIN C) tablet Take 1 tablet by mouth daily.      . calcium carbonate (OS-CAL) 600 MG TABS tablet Take 600 mg by mouth daily with breakfast.      . clonazePAM (KLONOPIN) 1 MG tablet Take 1 mg by mouth 2 (two) times daily as needed for anxiety.      Marland Kitchen EPINEPHrine (EPIPEN) 0.3 mg/0.3 mL SOAJ injection Inject into the muscle once as needed (allergic reaction).      . hydrocortisone-pramoxine (ANALPRAM-HC) 2.5-1 % rectal cream Place rectally 2 (two) times daily.  30 g  3  . hyoscyamine (LEVSIN SL) 0.125 MG SL tablet Place 1 tablet (0.125 mg total) under the tongue every 4 (four) hours  as needed for cramping.  30 tablet  0  . medroxyPROGESTERone (DEPO-PROVERA) 150 MG/ML injection Inject 150 mg into the muscle every 3 (three) months.      . Multiple Vitamin (MULTIVITAMIN WITH MINERALS) TABS tablet Take 1 tablet by mouth daily.      . sertraline (ZOLOFT) 50 MG tablet Take 50 mg by mouth every evening.       No current facility-administered medications on file prior to visit.   Allergies  Allergen Reactions  . Bee Venom Hives and Swelling       Review of Systems  Constitutional: Negative for fever and chills.  Respiratory: Negative for cough and shortness of breath.   Gastrointestinal: Negative for nausea, vomiting, abdominal pain and diarrhea.  Genitourinary: Negative for dysuria, urgency, frequency, hematuria and difficulty urinating.  Skin: Positive for color change.  Psychiatric/Behavioral: Negative for confusion.       Objective:   Physical Exam  Nursing note and vitals reviewed. Constitutional: She is oriented to person, place, and time. She appears well-developed and well-nourished. No distress.  HENT:  Head: Normocephalic and atraumatic.  Right Ear: External ear normal.  Left Ear: External ear normal.  Nose: Nose normal.  Mouth/Throat:  Uvula is midline. Mucous membranes are not pale, dry and not cyanotic. No oral lesions. No trismus in the jaw. No uvula swelling. Posterior oropharyngeal erythema present. No posterior oropharyngeal edema.  Nasal mucus and rhinorrhea.   Eyes: Conjunctivae and EOM are normal.  Neck: Neck supple. No tracheal deviation present.  posterior cervicular adenopathy with tenderness over mastoid bone no submandibular adenopathy no supercervicular adenopathy.  postive adenopathy in postcervicular chain.    Cardiovascular: Normal rate, regular rhythm, normal heart sounds and intact distal pulses.   No murmur heard. Pulmonary/Chest: Effort normal. No respiratory distress.  Musculoskeletal: Normal range of motion.    Lymphadenopathy:    She has cervical adenopathy.  Neurological: She is alert and oriented to person, place, and time.  Skin: Skin is warm and dry. There is erythema (mild).  Psychiatric: She has a normal mood and affect. Her behavior is normal.      Results for orders placed in visit on 07/07/14  POCT CBC      Result Value Ref Range   WBC 12.9 (*) 4.6 - 10.2 K/uL   Lymph, poc 4.1 (*) 0.6 - 3.4   POC LYMPH PERCENT 31.5  10 - 50 %L   MID (cbc) 0.9  0 - 0.9   POC MID % 7.0  0 - 12 %M   POC Granulocyte 7.9 (*) 2 - 6.9   Granulocyte percent 61.5  37 - 80 %G   RBC 5.34  4.04 - 5.48 M/uL   Hemoglobin 17.5 (*) 12.2 - 16.2 g/dL   HCT, POC 53.9 (*) 37.7 - 47.9 %   MCV 100.9 (*) 80 - 97 fL   MCH, POC 32.8 (*) 27 - 31.2 pg   MCHC 32.5  31.8 - 35.4 g/dL   RDW, POC 14.0     Platelet Count, POC 250  142 - 424 K/uL   MPV 8.3  0 - 99.8 fL  POCT SEDIMENTATION RATE      Result Value Ref Range   POCT SED RATE 2  0 - 22 mm/hr    Assessment & Plan:  3:26 PM- Treatment plan was discussed with patient who verbalizes understanding and agrees.   3:57 PM- Will send pt over to have CT scan performed to rule parotiditis versus mastoiditis or any other deep space neck infections due to the severity of pain and distress on exam.  ADDENDUM: CT normal. OK to start doxy, push fluids, recheck in clinic in 2d to ensure improving. If worsening at that time, consider additional labs such as repeating esr/cbc, HIV, EBV, CXR, etc  Adenopathy - Plan: POCT CBC, POCT SEDIMENTATION RATE, CT Head Wo Contrast, CT Soft Tissue Neck W Contrast, POCT glucose (manual entry), CANCELED: CT Soft Tissue Neck Wo Contrast  Neck pain on left side - Plan: POCT glucose (manual entry)  Dehydration  Trismus  Leukocytosis, unspecified  Meds ordered this encounter  Medications  . doxycycline (VIBRAMYCIN) 100 MG capsule    Sig: Take 1 capsule (100 mg total) by mouth 2 (two) times daily.    Dispense:  20 capsule    Refill:  0   . oxyCODONE-acetaminophen (ROXICET) 5-325 MG per tablet    Sig: Take 1 tablet by mouth every 8 (eight) hours as needed for severe pain.    Dispense:  20 tablet    Refill:  0    I personally performed the services described in this documentation, which was scribed in my presence. The recorded information has been reviewed and considered, and  addended by me as needed.  Delman Cheadle, MD MPH   EXAM: CT HEAD WITHOUT CONTRAST  TECHNIQUE: Contiguous axial images were obtained from the base of the skull through the vertex without intravenous contrast.  COMPARISON: None.  FINDINGS: The bony calvarium is intact. No gross soft tissue abnormality is seen. No findings to suggest acute hemorrhage, acute infarction or space-occupying mass lesion are noted.  IMPRESSION: No acute intracranial abnormality noted.   EXAM: CT NECK WITH CONTRAST  TECHNIQUE: Multidetector CT imaging of the neck was performed using the standard protocol following the bolus administration of intravenous contrast.  CONTRAST: 13m OMNIPAQUE IOHEXOL 300 MG/ML SOLN  COMPARISON: None.  FINDINGS: A metallic marker is marking the area of concern posterior to the left ear. At that location, there is a small enhancing mass immediately adjacent to the underlying muscle. This mass has an appearance similar to normal lymph nodes elsewhere in the neck. The mass measures 11 x 4 mm in maximum dimensions on image number 53 and 9 mm in AP diameter on axial image 16. The remainder of the examination is unremarkable.  IMPRESSION: Small reactive lymph node posterior to the left ear, corresponding to the palpable mass

## 2015-03-30 ENCOUNTER — Emergency Department (HOSPITAL_COMMUNITY)
Admission: EM | Admit: 2015-03-30 | Discharge: 2015-03-30 | Disposition: A | Discharge status: Home / Self Care | Payer: BLUE CROSS/BLUE SHIELD | Attending: Emergency Medicine | Admitting: Emergency Medicine

## 2015-03-30 ENCOUNTER — Emergency Department (HOSPITAL_COMMUNITY): Payer: BLUE CROSS/BLUE SHIELD

## 2015-03-30 ENCOUNTER — Encounter (HOSPITAL_COMMUNITY): Payer: Self-pay | Admitting: Emergency Medicine

## 2015-03-30 DIAGNOSIS — Z8619 Personal history of other infectious and parasitic diseases: Secondary | ICD-10-CM | POA: Insufficient documentation

## 2015-03-30 DIAGNOSIS — Z720 Tobacco use: Secondary | ICD-10-CM | POA: Diagnosis not present

## 2015-03-30 DIAGNOSIS — I10 Essential (primary) hypertension: Secondary | ICD-10-CM | POA: Insufficient documentation

## 2015-03-30 DIAGNOSIS — K529 Noninfective gastroenteritis and colitis, unspecified: Secondary | ICD-10-CM | POA: Diagnosis not present

## 2015-03-30 DIAGNOSIS — Z3202 Encounter for pregnancy test, result negative: Secondary | ICD-10-CM | POA: Insufficient documentation

## 2015-03-30 DIAGNOSIS — Z79899 Other long term (current) drug therapy: Secondary | ICD-10-CM | POA: Insufficient documentation

## 2015-03-30 DIAGNOSIS — Z8659 Personal history of other mental and behavioral disorders: Secondary | ICD-10-CM | POA: Insufficient documentation

## 2015-03-30 DIAGNOSIS — Z8742 Personal history of other diseases of the female genital tract: Secondary | ICD-10-CM | POA: Insufficient documentation

## 2015-03-30 DIAGNOSIS — R1030 Lower abdominal pain, unspecified: Secondary | ICD-10-CM | POA: Diagnosis present

## 2015-03-30 LAB — COMPREHENSIVE METABOLIC PANEL
ALT: 26 U/L (ref 0–35)
AST: 30 U/L (ref 0–37)
Albumin: 4.1 g/dL (ref 3.5–5.2)
Alkaline Phosphatase: 70 U/L (ref 39–117)
Anion gap: 9 (ref 5–15)
BUN: 13 mg/dL (ref 6–23)
CHLORIDE: 106 mmol/L (ref 96–112)
CO2: 21 mmol/L (ref 19–32)
Calcium: 8.8 mg/dL (ref 8.4–10.5)
Creatinine, Ser: 0.75 mg/dL (ref 0.50–1.10)
GLUCOSE: 112 mg/dL — AB (ref 70–99)
Potassium: 4.2 mmol/L (ref 3.5–5.1)
Sodium: 136 mmol/L (ref 135–145)
Total Bilirubin: 0.7 mg/dL (ref 0.3–1.2)
Total Protein: 7.6 g/dL (ref 6.0–8.3)

## 2015-03-30 LAB — URINALYSIS, ROUTINE W REFLEX MICROSCOPIC
BILIRUBIN URINE: NEGATIVE
Glucose, UA: NEGATIVE mg/dL
HGB URINE DIPSTICK: NEGATIVE
KETONES UR: NEGATIVE mg/dL
NITRITE: NEGATIVE
PH: 6 (ref 5.0–8.0)
Protein, ur: NEGATIVE mg/dL
SPECIFIC GRAVITY, URINE: 1.025 (ref 1.005–1.030)
Urobilinogen, UA: 0.2 mg/dL (ref 0.0–1.0)

## 2015-03-30 LAB — CBC WITH DIFFERENTIAL/PLATELET
Basophils Absolute: 0.1 10*3/uL (ref 0.0–0.1)
Basophils Relative: 1 % (ref 0–1)
Eosinophils Absolute: 0.2 10*3/uL (ref 0.0–0.7)
Eosinophils Relative: 2 % (ref 0–5)
HEMATOCRIT: 44.5 % (ref 36.0–46.0)
Hemoglobin: 15.3 g/dL — ABNORMAL HIGH (ref 12.0–15.0)
LYMPHS ABS: 4.2 10*3/uL — AB (ref 0.7–4.0)
LYMPHS PCT: 44 % (ref 12–46)
MCH: 33.7 pg (ref 26.0–34.0)
MCHC: 34.4 g/dL (ref 30.0–36.0)
MCV: 98 fL (ref 78.0–100.0)
MONOS PCT: 8 % (ref 3–12)
Monocytes Absolute: 0.7 10*3/uL (ref 0.1–1.0)
NEUTROS ABS: 4.5 10*3/uL (ref 1.7–7.7)
Neutrophils Relative %: 45 % (ref 43–77)
PLATELETS: 280 10*3/uL (ref 150–400)
RBC: 4.54 MIL/uL (ref 3.87–5.11)
RDW: 12.9 % (ref 11.5–15.5)
WBC: 9.6 10*3/uL (ref 4.0–10.5)

## 2015-03-30 LAB — URINE MICROSCOPIC-ADD ON

## 2015-03-30 LAB — LIPASE, BLOOD: LIPASE: 19 U/L (ref 11–59)

## 2015-03-30 LAB — PREGNANCY, URINE: PREG TEST UR: NEGATIVE

## 2015-03-30 MED ORDER — IOHEXOL 300 MG/ML  SOLN
100.0000 mL | Freq: Once | INTRAMUSCULAR | Status: AC | PRN
  Administered 2015-03-30: 100 mL via INTRAVENOUS

## 2015-03-30 MED ORDER — OXYCODONE-ACETAMINOPHEN 5-325 MG PO TABS: 1.0000 | ORAL_TABLET | ORAL | Status: DC | PRN

## 2015-03-30 MED ORDER — IOHEXOL 300 MG/ML  SOLN
50.0000 mL | Freq: Once | INTRAMUSCULAR | Status: AC | PRN
  Administered 2015-03-30: 50 mL via ORAL

## 2015-03-30 MED ORDER — MORPHINE SULFATE 4 MG/ML IJ SOLN
4.0000 mg | Freq: Once | INTRAMUSCULAR | Status: AC
  Administered 2015-03-30: 4 mg via INTRAVENOUS
  Filled 2015-03-30: qty 1

## 2015-03-30 MED ORDER — METRONIDAZOLE 500 MG PO TABS: 500.0000 mg | ORAL_TABLET | Freq: Three times a day (TID) | ORAL | Status: DC

## 2015-03-30 MED ORDER — SODIUM CHLORIDE 0.9 % IV BOLUS (SEPSIS)
1000.0000 mL | Freq: Once | INTRAVENOUS | Status: AC
  Administered 2015-03-30: 1000 mL via INTRAVENOUS

## 2015-03-30 MED ORDER — METRONIDAZOLE 500 MG PO TABS
500.0000 mg | ORAL_TABLET | Freq: Once | ORAL | Status: AC
  Administered 2015-03-30: 500 mg via ORAL
  Filled 2015-03-30: qty 1

## 2015-03-30 NOTE — ED Notes (Signed)
Nurse starting IV 

## 2015-03-30 NOTE — ED Notes (Signed)
Pt states that she has extensive abd history (crohn's/tumor/cdiff).  States that she has been having gen abd pain, nausea, and diarrhea since Tuesday.

## 2015-03-30 NOTE — ED Provider Notes (Signed)
CSN: [id number]     Arrival date & time 03/30/15  1241 History   First MD Initiated Contact with Patient 03/30/15 1315     Chief Complaint  Patient presents with  . Abdominal Pain  . Nausea  . Diarrhea     (Consider location/radiation/quality/duration/timing/severity/associated sxs/prior Treatment) HPI She developed diarrhea 4-5 days ago. It was very watery but no blood. The patient reports she gets diarrhea every so often so she didn't think too much about it. She did not give associated fever. 2 days ago she started getting associated lower abdominal pain and it has become fairly severe and stabbing over the course the past couple days. She has not developed any vomiting. The patient reports that she gets concerned when she developed these symptoms because she was diagnosed with C. difficile and had a hard time getting rid of it. She states that there were no apparent risk factors for that condition. At the time she reports he thought she had Crohn's disease but her subsequent colonoscopy did not show that. She reports that she is still somewhat concern for developing it because it is very strong in her family history. Past Medical History  Diagnosis Date  . Ovarian cyst   . Hypertension   . Endometriosis   . C. difficile diarrhea     Sept 2014  . Anxiety   . Depression   . Crohn's disease    Past Surgical History  Procedure Laterality Date  . Ovarian cyst removal    . Colonoscopy with propofol N/A 09/21/2013    Procedure: COLONOSCOPY WITH PROPOFOL;  Surgeon: Milus Banister, MD;  Location: [facility];  Service: Endoscopy;  Laterality: N/A;   Family History  Problem Relation Age of Onset  . Alzheimer's disease Mother   . Heart disease Father   . COPD Father    History  Substance Use Topics  . Smoking status: Current Every Day Smoker -- 0.50 packs/day    Types: Cigarettes    Last Attempt to Quit: 02/04/2013  . Smokeless tobacco: Never Used  . Alcohol Use: 2.4 oz/week     4 Glasses of wine per week     Comment: "Good week 2 bottles of wine, bad week 4 bottles of wine"-TAKES 2 GLASSES WINE NIGHTLY   OB History    Gravida Para Term Preterm AB TAB SAB Ectopic Multiple Living   2 1 1  0 1 0 1 0 0 1     Review of Systems 10 Systems reviewed and are negative for acute change except as noted in the HPI.    Allergies  Bee venom  Home Medications   Prior to Admission medications   Medication Sig Start Date End Date Taking? Authorizing Provider  EPINEPHrine (EPIPEN) 0.3 mg/0.3 mL SOAJ injection Inject into the muscle once as needed (allergic reaction).   Yes Historical Provider, MD  doxycycline (VIBRAMYCIN) 100 MG capsule Take 1 capsule (100 mg total) by mouth 2 (two) times daily. Patient not taking: Reported on 03/30/2015 07/07/14   Shawnee Knapp, MD  hydrocortisone-pramoxine [facility]) 2.5-1 % rectal cream Place rectally 2 (two) times daily. Patient not taking: Reported on 03/30/2015 10/04/13   Milus Banister, MD  hyoscyamine (LEVSIN SL) 0.125 MG SL tablet Place 1 tablet (0.125 mg total) under the tongue every 4 (four) hours as needed for cramping. Patient not taking: Reported on 03/30/2015 09/26/13   Milus Banister, MD  metroNIDAZOLE (FLAGYL) 500 MG tablet Take 1 tablet (500 mg total) by mouth 3 (  three) times daily. 03/30/15   Charlesetta Shanks, MD  oxyCODONE-acetaminophen (PERCOCET/ROXICET) 5-325 MG per tablet Take 1-2 tablets by mouth every 4 (four) hours as needed for severe pain. 03/30/15   Charlesetta Shanks, MD  oxyCODONE-acetaminophen (ROXICET) 5-325 MG per tablet Take 1 tablet by mouth every 8 (eight) hours as needed for severe pain. Patient not taking: Reported on 03/30/2015 07/07/14   Shawnee Knapp, MD   BP 154/91 mmHg  Pulse 73  Temp(Src) 98.3 F (36.8 C) (Oral)  Resp 17  SpO2 100%  LMP 03/20/2015 Physical Exam  Constitutional: She is oriented to person, place, and time. She appears well-developed and well-nourished.  HENT:  Head: Normocephalic and  atraumatic.  Eyes: EOM are normal. Pupils are equal, round, and reactive to light.  Neck: Neck supple.  Cardiovascular: Normal rate, regular rhythm, normal heart sounds and intact distal pulses.   Pulmonary/Chest: Effort normal and breath sounds normal.  Abdominal: Soft. Bowel sounds are normal. She exhibits no distension. There is tenderness.  Moderate lower abdominal tenderness right greater than left. No guarding or rebound.  Musculoskeletal: Normal range of motion. She exhibits no edema.  Neurological: She is alert and oriented to person, place, and time. She has normal strength. Coordination normal. GCS eye subscore is 4. GCS verbal subscore is 5. GCS motor subscore is 6.  Skin: Skin is warm, dry and intact.  Psychiatric: She has a normal mood and affect.    ED Course  Procedures (including critical care time) Labs Review Labs Reviewed  CBC WITH DIFFERENTIAL/PLATELET - Abnormal; Notable for the following:    Hemoglobin 15.3 (*)    Lymphs Abs 4.2 (*)    All other components within normal limits  COMPREHENSIVE METABOLIC PANEL - Abnormal; Notable for the following:    Glucose, Bld 112 (*)    All other components within normal limits  URINALYSIS, ROUTINE W REFLEX MICROSCOPIC - Abnormal; Notable for the following:    APPearance CLOUDY (*)    Leukocytes, UA MODERATE (*)    All other components within normal limits  URINE MICROSCOPIC-ADD ON - Abnormal; Notable for the following:    Squamous Epithelial / LPF MANY (*)    Bacteria, UA FEW (*)    All other components within normal limits  LIPASE, BLOOD  PREGNANCY, URINE    Imaging Review Ct Abdomen Pelvis W Contrast  03/30/2015   CLINICAL DATA:  Abdominal pain with nausea and diarrhea.  EXAM: CT ABDOMEN AND PELVIS WITH CONTRAST  TECHNIQUE: Multidetector CT imaging of the abdomen and pelvis was performed using the standard protocol following bolus administration of intravenous contrast.  CONTRAST:  72mL OMNIPAQUE IOHEXOL 300 MG/ML  SOLN, 143mL OMNIPAQUE IOHEXOL 300 MG/ML SOLN  COMPARISON:  CT abdomen pelvis 08/21/2013  FINDINGS: Lung bases are clear.  Heart size is normal.  Fatty infiltration of liver without focal liver lesion. Gallbladder and bile ducts normal. Pancreas and spleen normal  Kidneys are normal. No renal mass or obstruction or stone. Urinary bladder normal. 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 Ring-enhancing lesion right adnexa compatible with corpus luteum cyst.  Mild diffuse colonic edema has progressed since the prior study but is somewhat improved from the CT of 08/17/2013. Question chronic colitis. No significant edema in the pericolonic fat. No free fluid or abscess.  Negative for mass or adenopathy.  Lumbar spine is negative  IMPRESSION: Mild diffuse colonic thickening. This is been noted on prior studies and may reflect chronic colitis such as Crohn's disease, ulcerative  colitis, or chronic infection.   Electronically Signed   By: Franchot Gallo M.D.   On: 03/30/2015 14:53     EKG Interpretation None      MDM   Final diagnoses:  Colitis   CT shows colitis consistent with previous findings. The patient reported that ultimately she got diagnosed with C. difficile and not Crohn's disease. She does however have a very strong family history with multiple family members having Crohn's disease. At this point I do feel she is going to require further outpatient workup to differentiate the type of colitis present. The patient however is nontoxic and has a nonsurgical abdomen. She is afebrile without leukocytosis. CT findings are consistent with prior colitis. At this point I feel she is an appropriate candidate for outpatient Flagyl and reassessment by gastroenterology. The patient is counseled on signs and symptoms for which to return. Prescription is provided to submit a stool specimen which she was unable to do in the emergency department.    Charlesetta Shanks, MD 03/30/15 1600

## 2015-03-30 NOTE — Discharge Instructions (Signed)
Colitis Colitis is inflammation of the colon. Colitis can be a short-term or long-standing (chronic) illness. Crohn's disease and ulcerative colitis are 2 types of colitis which are chronic. They usually require lifelong treatment. CAUSES  There are many different causes of colitis, including:  Viruses.  Germs (bacteria).  Medicine reactions. SYMPTOMS   Diarrhea.  Intestinal bleeding.  Pain.  Fever.  Throwing up (vomiting).  Tiredness (fatigue).  Weight loss.  Bowel blockage. DIAGNOSIS  The diagnosis of colitis is based on examination and stool or blood tests. X-rays, CT scan, and colonoscopy may also be needed. TREATMENT  Treatment may include:  Fluids given through the vein (intravenously).  Bowel rest (nothing to eat or drink for a period of time).  Medicine for pain and diarrhea.  Medicines (antibiotics) that kill germs.  Cortisone medicines.  Surgery. HOME CARE INSTRUCTIONS   Get plenty of rest.  Drink enough water and fluids to keep your urine clear or pale yellow.  Eat a well-balanced diet.  Call your caregiver for follow-up as recommended. SEEK IMMEDIATE MEDICAL CARE IF:   You develop chills.  You have an oral temperature above 102 F (38.9 C), not controlled by medicine.  You have extreme weakness, fainting, or dehydration.  You have repeated vomiting.  You develop severe belly (abdominal) pain or are passing bloody or tarry stools. MAKE SURE YOU:   Understand these instructions.  Will watch your condition.  Will get help right away if you are not doing well or get worse. Document Released: 12/31/2004 Document Revised: 02/15/2012 Document Reviewed: 03/28/2010 [facility] Patient Information 2015 [place], [place]. This information is not intended to replace advice given to you by your health care provider. Make sure you discuss any questions you have with your health care provider.  Emergency Department Resource Guide 1) Find a Doctor and  Pay Out of Pocket Although you won't have to find out who is covered by your insurance plan, it is a good idea to ask around and get recommendations. You will then need to call the office and see if the doctor you have chosen will accept you as a new patient and what types of options they offer for patients who are self-pay. Some doctors offer discounts or will set up payment plans for their patients who do not have insurance, but you will need to ask so you aren't surprised when you get to your appointment.  2) Contact Your Local Health Department Not all health departments have doctors that can see patients for sick visits, but many do, so it is worth a call to see if yours does. If you don't know where your local health department is, you can check in your phone book. The CDC also has a tool to help you locate your state's health department, and many state websites also have listings of all of their local health departments.  3) Find a [facility] If your illness is not likely to be very severe or complicated, you may want to try a walk in clinic. These are popping up all over the country in pharmacies, drugstores, and shopping centers. They're usually staffed by nurse practitioners or physician assistants that have been trained to treat common illnesses and complaints. They're usually fairly quick and inexpensive. However, if you have serious medical issues or chronic medical problems, these are probably not your best option.  No Primary Care Doctor: - Call Health Connect at  [phone] - they can help you locate a primary care doctor that  accepts your  insurance, provides certain services, etc. - Physician Referral Service- [phone]  Chronic Pain Problems: Organization         Address  Phone   Notes  [facility]  [phone] Patients need to be referred by their primary care doctor.   Medication Assistance: Organization         Address  Phone   Notes  [facility] Medication [facility] [address] [place], [place] [address] [phone] --Must be a resident of [facility] -- Must have NO insurance coverage whatsoever (no Medicaid/ Medicare, etc.) -- The pt. MUST have a primary care doctor that directs their care regularly and follows them in the community   MedAssist  [phone]   [employer]  [phone]    Agencies that provide inexpensive medical care: Organization         Address  Phone   Notes  [place]  [phone]   [facility]    [phone]   [facility] [place], [place] [address] [phone]   [place] [address] [place] [phone]   Planned Parenthood    [phone]   [facility]    [phone]   [place] [address], [place] Phone:  [phone], Fax:  [phone] Hours of Operation:  9 am - 6 pm, M-F.  Also accepts Medicaid/Medicare and self-pay.  [facility] for [place], [address], [place] Phone: [phone], Fax: [phone]. Hours of Operation:  8:30 am - 5:30 pm, M-F.  Also accepts Medicaid and self-pay.  [facility] [place] [address] [place] Phone: [phone]   [place], [place], [place] [phone], Ext. [phone] Mondays & Thursdays: 7-9 AM.  First 15 patients are seen on a first come, first serve basis.    [place] Providers:  Organization         Address  Phone   Notes  [facility] [address],  [phone] Also accepts self-pay patients.  [facility] [address], [place]  [phone]   [place], [address], [place] [phone]   [facility] [address], [place] [phone]   Lucianne Lei [address], [place]   [phone] Only accepts [place] Access [place] patients after they have their name applied to their card.   Self-Pay (no insurance) in [facility]:  Organization         Address  Phone   Notes  Sickle Cell Patients, [facility] [place] [phone]   [facility] [place] [phone]   [facility] [place]  [place], [place], [place] [phone]   [facility]/Dr. Osei-Bonsu  [address] [place] or [address], [place] [phone] Phone number for both [place] and [place] locations is the same.  Urgent Medical and [facility] [address], [facility] [phone]   [facility] [address], [place] or [address] [phone]   [facility] [place] [phone]  [place] [phone], phone; [phone], fax Sees patients 1st and 3rd Saturday of every month.  Must not qualify for public or private insurance (i.e. Medicaid, Medicare, [place] Health Choice, Veterans' Benefits)  Household income should be no more than 200% of the poverty level The clinic cannot treat you if you are pregnant or think you are pregnant  Sexually transmitted diseases are not treated at the clinic.    Dental Care: Organization         Address  Phone  Notes  [facility] Department of [facility] [place] [phone] Accepts children up to age 68 who are enrolled in [place] or [place]; pregnant women with a Medicaid card; and children who have applied for Medicaid or [place] Health Choice, but were declined, whose parents can pay a reduced fee at time of service.  [facility] Department of [facility]  [address], [place] [phone] Accepts children up to age 34 who are enrolled in [place] or [place]; pregnant women with a Medicaid card; and children who have applied for Medicaid or [place] Health Choice, but were declined, whose parents can pay a reduced fee at time of service.  [facility] Access PRO[facility]  [place] [phone] Patients are seen by appointment only. Walk-ins are not accepted. [place] will see patients 39 years of age and older. Monday - Tuesday (8am-5pm) Most Wednesdays (8:30-5pm) $30 per visit, cash only  [facility] Adult [facility] Access PRO[facility]  [address], [facility] [phone] Patients are seen by appointment only. Walk-ins are not accepted. [place] will see patients 55 years of age and older. One Wednesday Evening (Monthly: Volunteer Based).  $30 per visit, cash only  [place]  [phone] for adults; Children under age 57, call Graduate Pediatric Dentistry at [phone]. Children aged 75-14, please call [phone] to request a pediatric application.  Dental services are provided in all areas of dental care including fillings, crowns and bridges, complete and partial dentures, implants, gum treatment, root canals, and extractions. Preventive care is also provided. Treatment is provided to both adults and children. Patients are selected via a lottery and there is often a waiting list.   [facility] [address] [place]  [phone] [email or web address]   [facility] [address] [place], [place] [phone], Ext. [phone] Second and Fourth Thursday of each month, opens at 6:30 AM; Clinic ends at 9 AM.  Patients are seen on a first-come first-served basis, and a limited number are seen during each clinic.   [facility]  [address] Hillard Danker [place], [place] [phone]   Eligibility Requirements You must have lived in [place], [place], or  [place] counties for at least the last three months.   You cannot be eligible for state or federal sponsored [employer], including [employer], [place], or [employer].   You generally cannot be eligible for healthcare insurance through your employer.    How to apply: Eligibility screenings are held every Tuesday and Wednesday afternoon from 1:00 pm until 4:00 pm. You do not need an appointment for the interview!  [facility] [address] [place], [place]   [place]  [place]  [place]  [phone]  Behavioral Health Resources in the Community: Intensive Outpatient Programs Organization         Address  Phone  Notes  [place]. [address], [place], [place] [phone]   [facility] [address] [place], [place]   ADS: Alcohol & Drug S[facility] [address] [place], [place]   [place] [address]  [place], [place] or [phone]   Substance Abuse Resources Organization         Address  Phone  Notes  Alcohol and Drug Services  [phone]   [place]  [phone]   [facility] [place]   Chinita Pester  [phone]   Residential & Outpatient Substance Abuse Program  [phone]   Psychological Services Organization         Address  Phone  Notes  [facility] [place]  [place]  [phone]   [place] [address] [place] or [phone]    Mobile Crisis Teams Organization         Address  Phone  Notes  [facility], Mobile Crisis Care Unit  [phone]   [facility]  [address] [place], [place]   [place] [address], [place] [phone]    Self-Help/Support Groups Organization         Address  Phone             Notes  [place]. of [place] - variety of support groups  [place] Call for more information  Narcotics Anonymous (NA), [facility] [address], [employer] [place]  2 meetings at this location   Special educational needs teacher         Address  Phone  Notes  [facility] Residential Treatment [place],    [place]  [phone]   [facility])  [address], [place] [id number], [place], [place]   [place], [place] [phone] Admissions: 8am-3pm M-F  Incentives Substance [place] [address]    [place], [place] [phone]   [facility] [address] [place], [place], [place]   [facility] [address],  [place], [place]   [facility] Programs - Intensive Outpatient [address] Kristeen [address], [place], [place]   [facility] [address]  [place], [place] [phone] or [phone]   [facility] Services (RTS) [address] [place], [place] Accepts Medicaid  [facility] [place] [address],  [place] [phone] Substance Abuse/Addiction Treatment   [facility] Organization         Address  Phone  Notes  [facility]  [phone]   Domenic Schwab, PhD [address] Arlis Porta [place], [place]   [phone] or [phone]   [place] Mayfield [place], [place] [phone]   [place] [address], [place], [place] [phone] Insurance/Medicaid/sponsorship through [employer] and Families [address] [place]                                    [place], [place] [phone] [place],  [place] [phone]     Dr. Adele Schilder  [phone]   [facility] [place] Dept. 1) [address] [place] 2) [place] 3)  [place] [address], [place] [phone]  [phone]   [place] [phone] or [phone] (After Hours)

## 2016-04-06 IMAGING — CT CT ABD-PELV W/ CM
1 of 2 series · 15 of 32 positions shown, 19 images · IV contrast (OMNIPAQUE 300)
Comparison: CT abdomen pelvis 08/21/2013

CLINICAL DATA: Abdominal pain with nausea and diarrhea.

EXAM:
CT ABDOMEN AND PELVIS WITH CONTRAST
TECHNIQUE: Multidetector CT imaging of the abdomen and pelvis was performed
using the standard protocol following bolus administration of
intravenous contrast.
CONTRAST:  50mL OMNIPAQUE IOHEXOL 300 MG/ML SOLN, 100mL OMNIPAQUE
IOHEXOL 300 MG/ML SOLN

[Series 2: abd/pel with · axial · 0.74mm/px · z∈[-449,-54]mm · 15 of 87 slices shown, 19 images]
[im 4/87  soft-tissue]
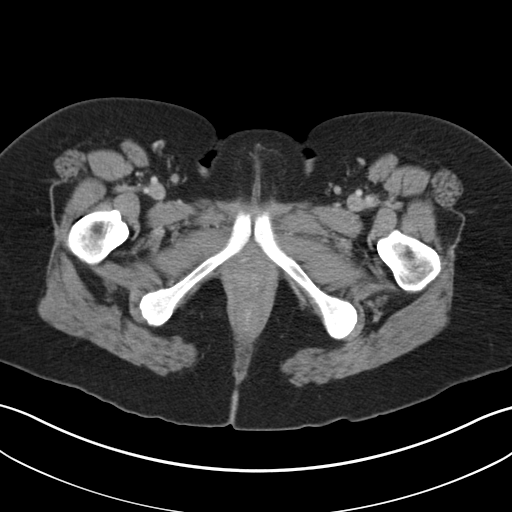
[im 4/87  bone]
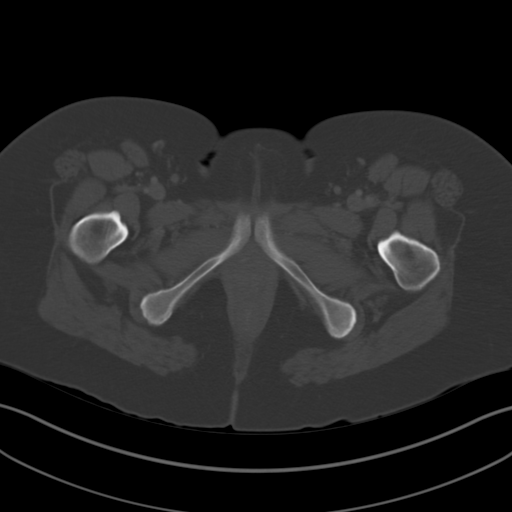
[im 11/87  soft-tissue]
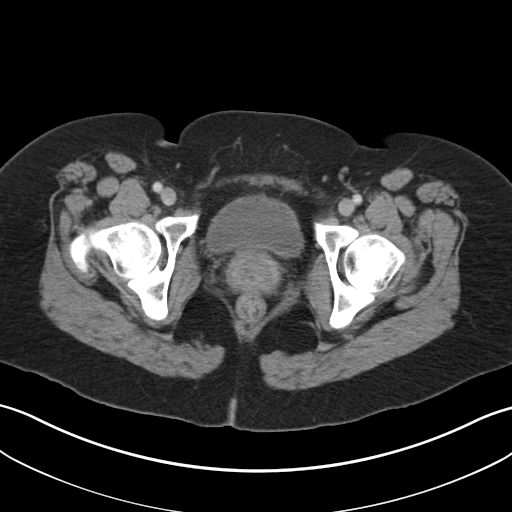
[im 18/87  soft-tissue]
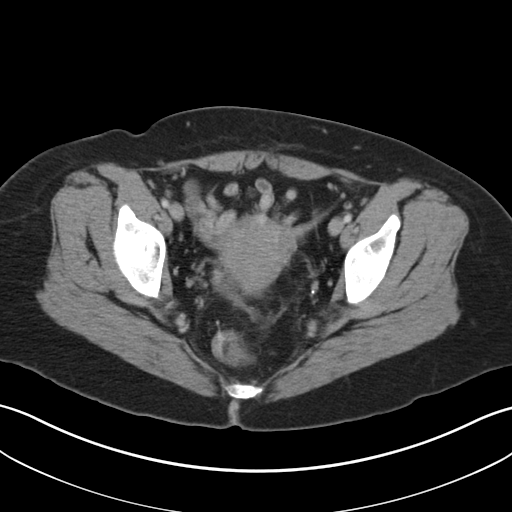
[im 26/87  soft-tissue]
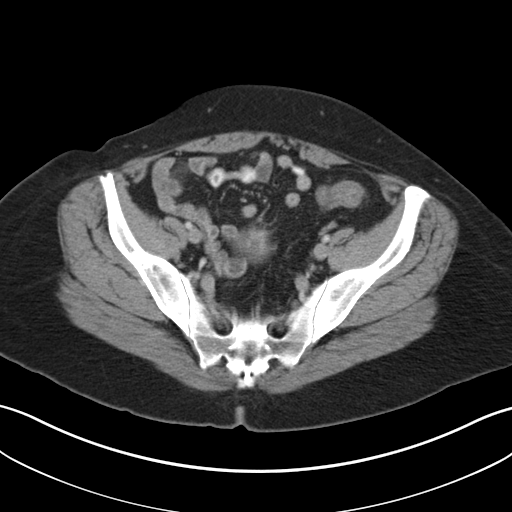
[im 29/87  soft-tissue]
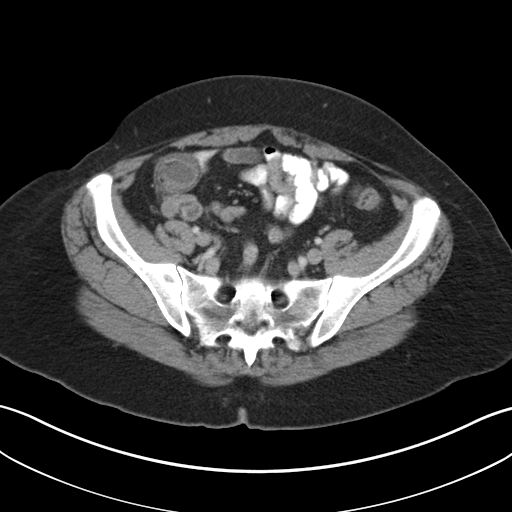
[im 36/87  soft-tissue]
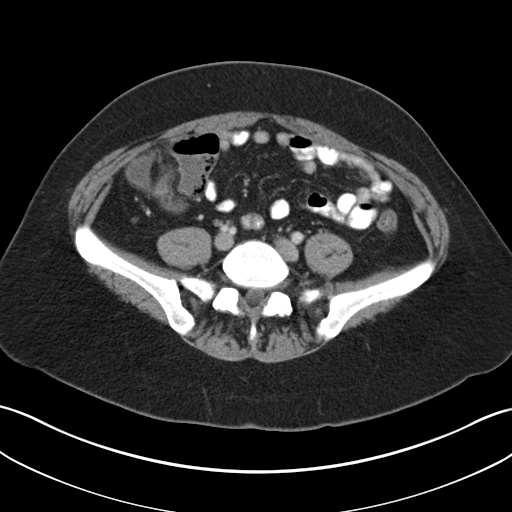
[im 44/87  soft-tissue]
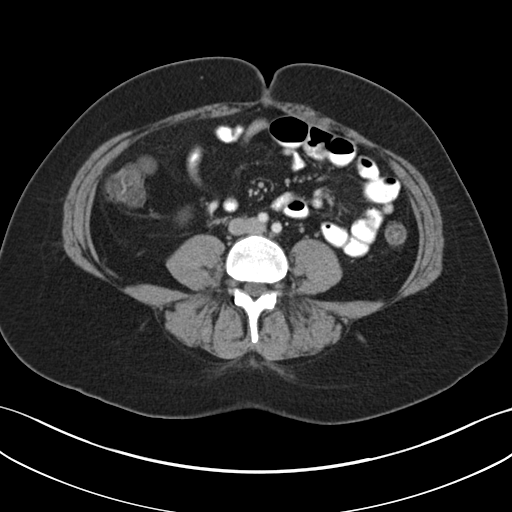
[im 51/87  soft-tissue]
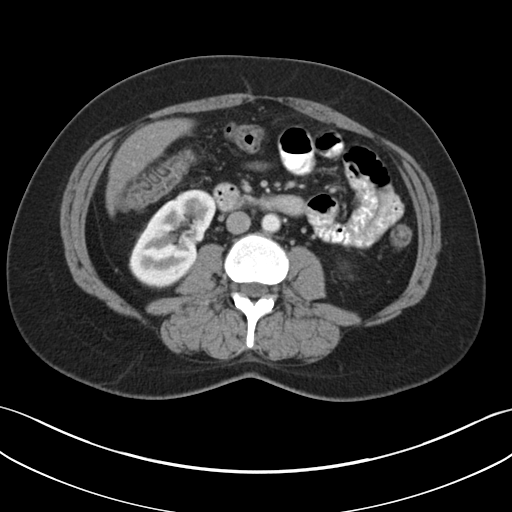
[im 58/87  soft-tissue]
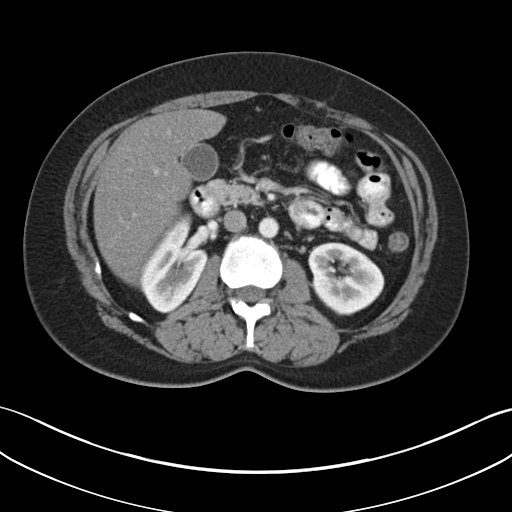
[im 58/87  bone]
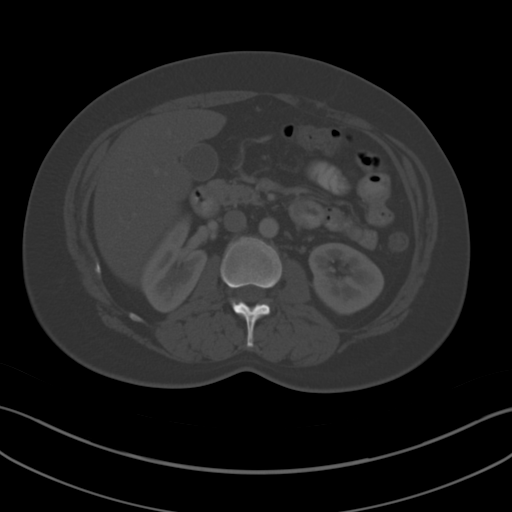
[im 61/87  soft-tissue]
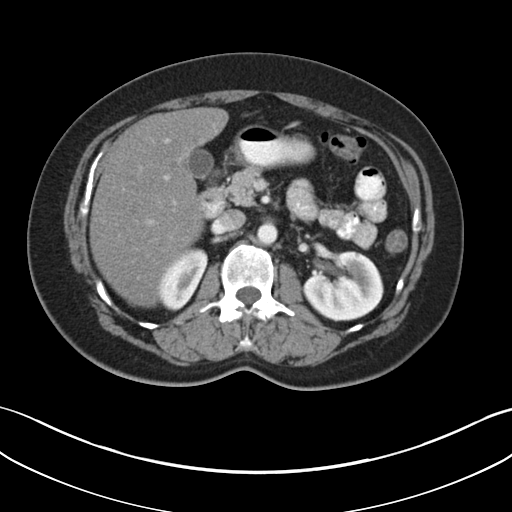
[im 69/87  soft-tissue]
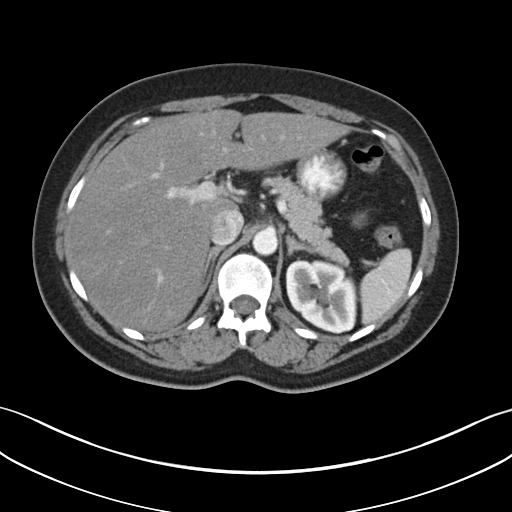
[im 72/87  lung]
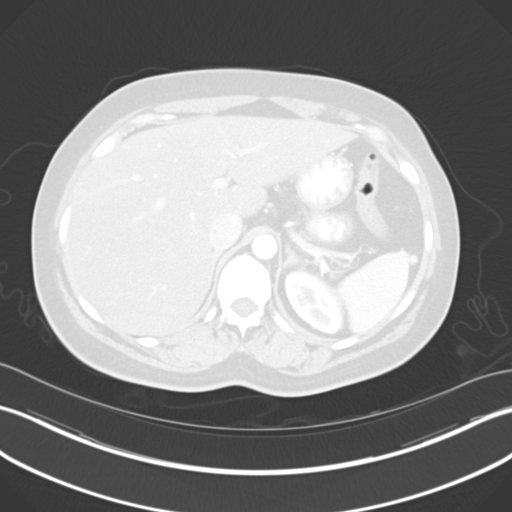
[im 76/87  soft-tissue]
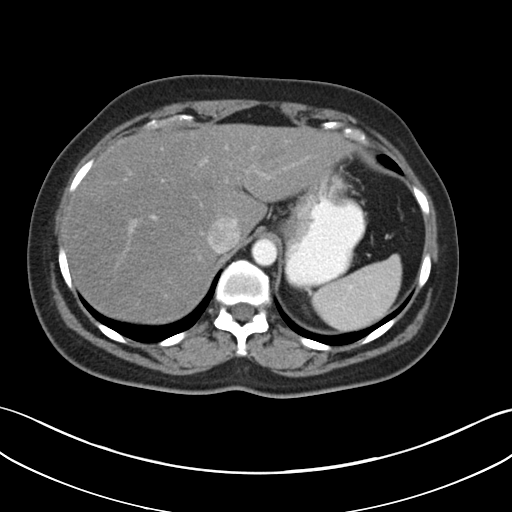
[im 76/87  lung]
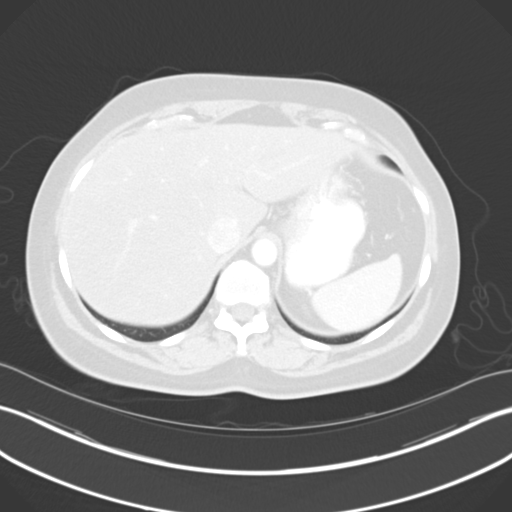
[im 79/87  lung]
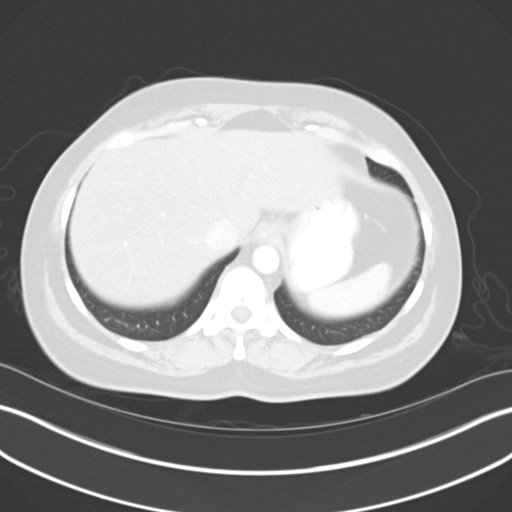
[im 83/87  soft-tissue]
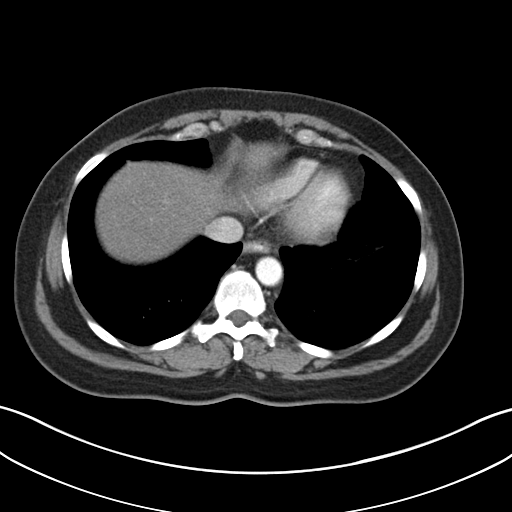
[im 83/87  lung]
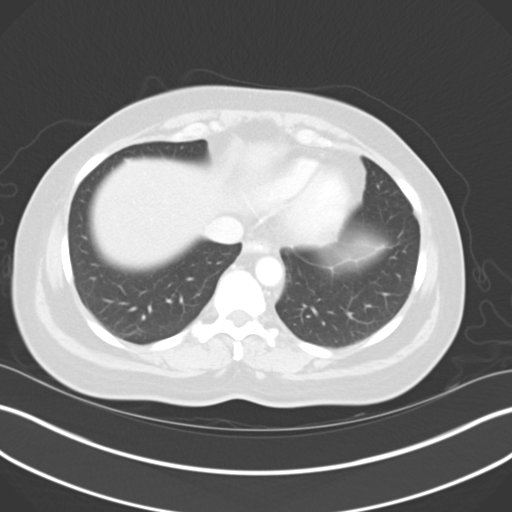

[15 of 32 positions shown; findings below may reference images not displayed]

FINDINGS: Lung bases are clear.  Heart size is normal.

Fatty infiltration of liver without focal liver lesion. Gallbladder
and bile ducts normal. Pancreas and spleen normal

Kidneys are normal. No renal mass or obstruction or stone. Urinary
bladder normal. 2 cm leiomyoma fundus of uterus on the left
unchanged. Ring-enhancing lesion right adnexa compatible with corpus
luteum cyst.

Mild diffuse colonic edema has progressed since the prior study but
is somewhat improved from the CT of 08/17/2013. Question chronic
colitis. No significant edema in the pericolonic fat. No free fluid
or abscess.

Negative for mass or adenopathy.

Lumbar spine is negative
IMPRESSION: Mild diffuse colonic thickening. This is been noted on prior studies
and may reflect chronic colitis such as Crohn's disease, ulcerative
colitis, or chronic infection.

## 2016-05-20 ENCOUNTER — Encounter (HOSPITAL_COMMUNITY): Payer: Self-pay

## 2016-05-20 ENCOUNTER — Emergency Department (HOSPITAL_COMMUNITY)
Admission: EM | Admit: 2016-05-20 | Discharge: 2016-05-20 | Disposition: A | Discharge status: Home / Self Care | Payer: BLUE CROSS/BLUE SHIELD | Attending: Emergency Medicine | Admitting: Emergency Medicine

## 2016-05-20 ENCOUNTER — Emergency Department (HOSPITAL_COMMUNITY): Payer: BLUE CROSS/BLUE SHIELD

## 2016-05-20 DIAGNOSIS — R0789 Other chest pain: Secondary | ICD-10-CM

## 2016-05-20 DIAGNOSIS — F329 Major depressive disorder, single episode, unspecified: Secondary | ICD-10-CM | POA: Insufficient documentation

## 2016-05-20 DIAGNOSIS — I10 Essential (primary) hypertension: Secondary | ICD-10-CM | POA: Insufficient documentation

## 2016-05-20 DIAGNOSIS — Z79899 Other long term (current) drug therapy: Secondary | ICD-10-CM | POA: Insufficient documentation

## 2016-05-20 DIAGNOSIS — F17210 Nicotine dependence, cigarettes, uncomplicated: Secondary | ICD-10-CM | POA: Insufficient documentation

## 2016-05-20 DIAGNOSIS — R197 Diarrhea, unspecified: Secondary | ICD-10-CM | POA: Insufficient documentation

## 2016-05-20 DIAGNOSIS — K219 Gastro-esophageal reflux disease without esophagitis: Secondary | ICD-10-CM | POA: Insufficient documentation

## 2016-05-20 DIAGNOSIS — Z7982 Long term (current) use of aspirin: Secondary | ICD-10-CM | POA: Insufficient documentation

## 2016-05-20 LAB — BASIC METABOLIC PANEL
Anion gap: 10 (ref 5–15)
BUN: 13 mg/dL (ref 6–20)
CO2: 22 mmol/L (ref 22–32)
CREATININE: 0.61 mg/dL (ref 0.44–1.00)
Calcium: 9.2 mg/dL (ref 8.9–10.3)
Chloride: 103 mmol/L (ref 101–111)
GFR calc Af Amer: >60 mL/min (ref >60)
Glucose, Bld: 110 mg/dL — ABNORMAL HIGH (ref 65–99)
Potassium: 4 mmol/L (ref 3.5–5.1)
SODIUM: 135 mmol/L (ref 135–145)

## 2016-05-20 LAB — CBC
HCT: 44.9 % (ref 36.0–46.0)
Hemoglobin: 15.4 g/dL — ABNORMAL HIGH (ref 12.0–15.0)
MCH: 33.6 pg (ref 26.0–34.0)
MCHC: 34.3 g/dL (ref 30.0–36.0)
MCV: 98 fL (ref 78.0–100.0)
PLATELETS: 273 10*3/uL (ref 150–400)
RBC: 4.58 MIL/uL (ref 3.87–5.11)
RDW: 13 % (ref 11.5–15.5)
WBC: 10.9 10*3/uL — ABNORMAL HIGH (ref 4.0–10.5)

## 2016-05-20 LAB — I-STAT TROPONIN, ED: Troponin i, poc: 0.01 ng/mL (ref 0.00–0.08)

## 2016-05-20 MED ORDER — KETOROLAC TROMETHAMINE 30 MG/ML IJ SOLN
30.0000 mg | Freq: Once | INTRAMUSCULAR | Status: AC
  Administered 2016-05-20: 30 mg via INTRAVENOUS
  Filled 2016-05-20: qty 1

## 2016-05-20 MED ORDER — GI COCKTAIL ~~LOC~~
30.0000 mL | Freq: Once | ORAL | Status: AC
  Administered 2016-05-20: 30 mL via ORAL
  Filled 2016-05-20: qty 30

## 2016-05-20 MED ORDER — RANITIDINE HCL 150 MG PO TABS: 150.0000 mg | ORAL_TABLET | Freq: Two times a day (BID) | ORAL | Status: DC

## 2016-05-20 MED ORDER — SODIUM CHLORIDE 0.9 % IV BOLUS (SEPSIS)
1000.0000 mL | Freq: Once | INTRAVENOUS | Status: AC
  Administered 2016-05-20: 1000 mL via INTRAVENOUS

## 2016-05-20 NOTE — ED Provider Notes (Signed)
CSN: [id number]     Arrival date & time 05/20/16  0945 History   First MD Initiated Contact with Patient 05/20/16 1128     Chief Complaint  Patient presents with  . Chest Pain  . Joint Pain  . Back Pain     (Consider location/radiation/quality/duration/timing/severity/associated sxs/prior Treatment) HPI Comments: [name] is a 40 y.o. female with a PMHx of ovarian cysts, HTN, endometriosis, and anxiety/depression, who presents to the ED with complaints of sudden onset chest pain began yesterday around 2 PM while she was sitting outside reading a book. She describes the pain as 7/10 constant central stabbing pain which is nonradiating, with no known aggravating factors, unchanged with inspiration or exertion, and unrelieved with 325 mg aspirin, Zantac, and other over-the-counter antacids. She states she thought it was just indigestion at first. She also reports some watery diarrhea last night approx. 4-5 episodes of nonbloody stools, states that this might be related to her IBS. She denies any other associated symptoms to me, although she reported to triage nurse that she had joint and back pain and a feeling like she "can't catch my breath at times," although she does not report this to me. She specifically denies any shortness of breath or difficulty breathing.  She denies any fevers, chills, diaphoresis, shortness breath, cough, wheezing, leg swelling, recent travel/surgery/immobilization, history of DVT/PE, estrogen use, abdominal pain, nausea, vomiting, constipation, obstipation, melena, hematochezia, dysuria, hematuria, numbness, tingling, weakness, claudication, orthopnea, or lightheadedness. She is a smoker. Positive family history of MI in her father at age 80 and 17, and suspected MI in her sister at around 68 years old although she is not sure of this history. She also reports that her father has lung cancer and believes he had a blood clot in his lungs at some point in time during the  period. No other family hx of PE/DVT.   Patient is a 40 y.o. female presenting with chest pain and back pain. The history is provided by the patient and medical records. No language interpreter was used.  Chest Pain Pain location:  Substernal area Pain quality: stabbing   Pain radiates to:  Does not radiate Pain radiates to the back: no   Pain severity:  Moderate Onset quality:  Sudden Duration:  1 day Timing:  Constant Progression:  Unchanged Chronicity:  New Context: at rest   Relieved by:  Nothing Worsened by:  Nothing tried Ineffective treatments:  Antacids Associated symptoms: no abdominal pain, no claudication, no cough, no diaphoresis, no fever, no lower extremity edema, no nausea, no numbness, no orthopnea, no shortness of breath, not vomiting and no weakness   Risk factors: hypertension and smoking   Risk factors: no birth control, no coronary artery disease, no diabetes mellitus, no high cholesterol, no immobilization, no prior DVT/PE and no surgery   Back Pain Associated symptoms: chest pain   Associated symptoms: no abdominal pain, no dysuria, no fever, no numbness and no weakness     Past Medical History  Diagnosis Date  . Ovarian cyst   . Hypertension   . Endometriosis   . C. difficile diarrhea     Sept 2014  . Anxiety   . Depression    Past Surgical History  Procedure Laterality Date  . Ovarian cyst removal    . Colonoscopy with propofol N/A 09/21/2013    Procedure: COLONOSCOPY WITH PROPOFOL;  Surgeon: Milus Banister, MD;  Location: [facility];  Service: Endoscopy;  Laterality: N/A;   Family History  Problem Relation Age of Onset  . Alzheimer's disease Mother   . Heart disease Father   . COPD Father    Social History  Substance Use Topics  . Smoking status: Current Every Day Smoker -- 0.50 packs/day    Types: Cigarettes    Last Attempt to Quit: 02/04/2013  . Smokeless tobacco: Never Used  . Alcohol Use: 2.4 oz/week    4 Glasses of wine per week      Comment: 2-3 times a week   OB History    Gravida Para Term Preterm AB TAB SAB Ectopic Multiple Living   2 1 1  0 1 0 1 0 0 1     Review of Systems  Constitutional: Negative for fever, chills and diaphoresis.  Respiratory: Negative for cough, shortness of breath and wheezing.   Cardiovascular: Positive for chest pain. Negative for orthopnea, claudication and leg swelling.  Gastrointestinal: Positive for diarrhea. Negative for nausea, vomiting, abdominal pain, constipation and blood in stool.  Genitourinary: Negative for dysuria and hematuria.  Musculoskeletal: Negative for myalgias and arthralgias.  Skin: Negative for color change.  Allergic/Immunologic: Negative for immunocompromised state.  Neurological: Negative for weakness, light-headedness and numbness.  Psychiatric/Behavioral: Negative for confusion.   10 Systems reviewed and are negative for acute change except as noted in the HPI.    Allergies  Bee venom  Home Medications   Prior to Admission medications   Medication Sig Start Date End Date Taking? Authorizing Provider  aspirin 325 MG tablet Take 325 mg by mouth every 6 (six) hours as needed for mild pain.   Yes Historical Provider, MD  ibuprofen (ADVIL,MOTRIN) 200 MG tablet Take 400 mg by mouth every 6 (six) hours as needed for mild pain.   Yes Historical Provider, MD  ranitidine (ZANTAC) 150 MG tablet Take 150 mg by mouth daily as needed for heartburn.   Yes Historical Provider, MD  doxycycline (VIBRAMYCIN) 100 MG capsule Take 1 capsule (100 mg total) by mouth 2 (two) times daily. Patient not taking: Reported on 03/30/2015 07/07/14   Shawnee Knapp, MD  EPINEPHrine (EPIPEN) 0.3 mg/0.3 mL SOAJ injection Inject into the muscle once as needed (allergic reaction).    Historical Provider, MD  hydrocortisone-pramoxine [facility]) 2.5-1 % rectal cream Place rectally 2 (two) times daily. Patient not taking: Reported on 03/30/2015 10/04/13   Milus Banister, MD  hyoscyamine  (LEVSIN SL) 0.125 MG SL tablet Place 1 tablet (0.125 mg total) under the tongue every 4 (four) hours as needed for cramping. Patient not taking: Reported on 03/30/2015 09/26/13   Milus Banister, MD  metroNIDAZOLE (FLAGYL) 500 MG tablet Take 1 tablet (500 mg total) by mouth 3 (three) times daily. Patient not taking: Reported on 05/20/2016 03/30/15   Charlesetta Shanks, MD  oxyCODONE-acetaminophen (PERCOCET/ROXICET) 5-325 MG per tablet Take 1-2 tablets by mouth every 4 (four) hours as needed for severe pain. Patient not taking: Reported on 05/20/2016 03/30/15   Charlesetta Shanks, MD  oxyCODONE-acetaminophen (ROXICET) 5-325 MG per tablet Take 1 tablet by mouth every 8 (eight) hours as needed for severe pain. Patient not taking: Reported on 03/30/2015 07/07/14   Shawnee Knapp, MD   BP 133/101 mmHg  Pulse 88  Temp(Src) 98.2 F (36.8 C) (Oral)  Resp 17  Ht 5\' 4"  (1.626 m)  Wt 77.111 kg  BMI 29.17 kg/m2  SpO2 100%  LMP 05/06/2016 Physical Exam  Constitutional: She is oriented to person, place, and time. Vital signs are normal. She appears well-developed and well-nourished.  Non-toxic appearance. No distress.  Afebrile, nontoxic, NAD  HENT:  Head: Normocephalic and atraumatic.  Mouth/Throat: Oropharynx is clear and moist and mucous membranes are normal.  Eyes: Conjunctivae and EOM are normal. Right eye exhibits no discharge. Left eye exhibits no discharge.  Neck: Normal range of motion. Neck supple.  Cardiovascular: Normal rate, regular rhythm, normal heart sounds and intact distal pulses.  Exam reveals no gallop and no friction rub.   No murmur heard. RRR, nl s1/s2, no m/r/g, distal pulses intact, no pedal edema   Pulmonary/Chest: Effort normal and breath sounds normal. No respiratory distress. She has no decreased breath sounds. She has no wheezes. She has no rhonchi. She has no rales. She exhibits tenderness. She exhibits no crepitus, no deformity and no retraction.    CTAB in all lung fields, no w/r/r,  no hypoxia or increased WOB, speaking in full sentences, SpO2 100% on RA  Chest wall with mild sternal-region TTP without crepitus, deformities, or retractions   Abdominal: Soft. Normal appearance and bowel sounds are normal. She exhibits no distension. There is no tenderness. There is no rigidity, no rebound, no guarding, no CVA tenderness, no tenderness at McBurney's point and negative Murphy's sign.  Soft, NTND, +BS throughout, no r/g/r, neg murphy's, neg mcburney's, no CVA TTP   Musculoskeletal: Normal range of motion.  MAE x4 Strength and sensation grossly intact Distal pulses intact Gait steady No pedal edema, neg homan's bilaterally   Neurological: She is alert and oriented to person, place, and time. She has normal strength. No sensory deficit.  Skin: Skin is warm, dry and intact. No rash noted.  Psychiatric: She has a normal mood and affect.  Nursing note and vitals reviewed.   ED Course  Procedures (including critical care time) Labs Review Labs Reviewed  BASIC METABOLIC PANEL - Abnormal; Notable for the following:    Glucose, Bld 110 (*)    All other components within normal limits  CBC - Abnormal; Notable for the following:    WBC 10.9 (*)    Hemoglobin 15.4 (*)    All other components within normal limits  Randolm Idol, ED    Imaging Review Dg Chest 2 View  05/20/2016  CLINICAL DATA:  Chest pain. EXAM: CHEST  2 VIEW COMPARISON:  Radiograph of August 26, 2013. FINDINGS: The heart size and mediastinal contours are within normal limits. Both lungs are clear. No pneumothorax or pleural effusion is noted. The visualized skeletal structures are unremarkable. IMPRESSION: No active cardiopulmonary disease. Electronically Signed   By: Marijo Conception, M.D.   On: 05/20/2016 11:41   I have personally reviewed and evaluated these images and lab results as part of my medical decision-making.   EKG Interpretation   Date/Time:  Wednesday May 20 2016 09:57:31  EDT Ventricular Rate:  86 PR Interval:  154 QRS Duration: 100 QT Interval:  360 QTC Calculation: 430 R Axis:   77 Text Interpretation:  Sinus rhythm Borderline repolarization abnormality  Baseline wander in lead(s) I II aVR Otherwise normal ECG No significant  change since last tracing Confirmed by KNOTT MD, DANIEL [id number]) on  05/20/2016 11:32:12 AM      MDM   Final diagnoses:  Atypical chest pain  Diarrhea, unspecified type  Gastroesophageal reflux disease, esophagitis presence not specified    40 y.o. female here with CP x1 day, also complains of diarrhea overnight but states this may be due to her IBS. On exam, reproducible CP in the sternal region, no crepitus or retractions.  Lungs clear, no LE swelling, no tachycardia or hypoxia, PERC neg, doubt PE. EKG reassuring without acute ischemic findings. CXR clear. Awaiting labs. Will give toradol, GI cocktail, and fluids. Already has ASA, and she is driving so we can't give her morphine. I suspect MsK vs GI related pain. Will reassess shortly.   1:37 PM Pt feeling MUCH better after GI cocktail and toradol. Labs all reassuring. Given neg trop at 24hrs since onset, I feel we do not need to repeat this, unlikely cardiac related. Likely GI related vs MsK. Discussed diet modifications for GERD, zantac rx given, OTC antacids discussed. Tylenol PRN pain. F/up with PCP in 1wk. I explained the diagnosis and have given explicit precautions to return to the ER including for any other new or worsening symptoms. The patient understands and accepts the medical plan as it's been dictated and I have answered their questions. Discharge instructions concerning home care and prescriptions have been given. The patient is STABLE and is discharged to home in good condition.  BP 133/101 mmHg  Pulse 88  Temp(Src) 98.2 F (36.8 C) (Oral)  Resp 17  Ht 5\' 4"  (1.626 m)  Wt 77.111 kg  BMI 29.17 kg/m2  SpO2 100%  LMP 05/06/2016  Meds ordered this encounter   Medications  . gi cocktail (Maalox,Lidocaine,Donnatal)    Sig:   . ketorolac (TORADOL) 30 MG/ML injection 30 mg    Sig:   . sodium chloride 0.9 % bolus 1,000 mL    Sig:   . ranitidine (ZANTAC) 150 MG tablet    Sig: Take 1 tablet (150 mg total) by mouth 2 (two) times daily.    Dispense:  30 tablet    Refill:  0    Order Specific Question:  Supervising Provider    Answer:  Noemi Chapel [[id number]]     Della Homan Camprubi-Soms, PA-C 05/20/16 1339  Leo Grosser, MD 05/20/16 2003

## 2016-05-20 NOTE — Discharge Instructions (Signed)
Your labs, xray, and EKG are all reassuring, your chest pain could be a variety of things including gas pain, muscle pain, indigestion, and other nonemergent issues. You will need to take zantac as directed, and avoid spicy/fatty/acidic foods, avoid soda/coffee/tea/alcohol. Avoid laying down flat within 30 minutes of eating. Avoid NSAIDs like ibuprofen/aleve/motrin/etc on an empty stomach. May consider using over the counter tums/maalox/antacids as needed for additional relief. Use additional tylenol as needed for pain. Stay well hydrated. You may consider using heat to the areas of pain, no more than 20 minutes every hour. Follow the diet listed below to help with your diarrhea/IBS. Follow up with your regular doctor in 1 week for recheck of symptoms. Return to the ER for changes or worsening symptoms.  SEEK IMMEDIATE MEDICAL ATTENTION IF: You develop a fever.  Your chest pains become severe or intolerable.  You develop new, unexplained symptoms (problems).  You develop shortness of breath, nausea, vomiting, sweating or feel light headed.  You develop a new cough or you cough up blood. You develop new leg swelling   Chest Wall Pain Chest wall pain is pain in or around the bones and muscles of your chest. Sometimes, an injury causes this pain. Sometimes, the cause may not be known. This pain may take several weeks or longer to get better. HOME CARE INSTRUCTIONS  Pay attention to any changes in your symptoms. Take these actions to help with your pain:   Rest as told by your health care provider.   Avoid activities that cause pain. These include any activities that use your chest muscles or your abdominal and side muscles to lift heavy items.   If directed, apply ice to the painful area:  Put ice in a plastic bag.  Place a towel between your skin and the bag.  Leave the ice on for 20 minutes, 2-3 times per day.  Take over-the-counter and prescription medicines only as told by your health  care provider.  Do not use tobacco products, including cigarettes, chewing tobacco, and e-cigarettes. If you need help quitting, ask your health care provider.  Keep all follow-up visits as told by your health care provider. This is important. SEEK MEDICAL CARE IF:  You have a fever.  Your chest pain becomes worse.  You have new symptoms. SEEK IMMEDIATE MEDICAL CARE IF:  You have nausea or vomiting.  You feel sweaty or light-headed.  You have a cough with phlegm (sputum) or you cough up blood.  You develop shortness of breath.   This information is not intended to replace advice given to you by your health care provider. Make sure you discuss any questions you have with your health care provider.   Document Released: 11/23/2005 Document Revised: 08/14/2015 Document Reviewed: 02/18/2015 Elsevier Interactive Patient Education 2016 [place] for Gastroesophageal Reflux Disease, Adult When you have gastroesophageal reflux disease (GERD), the foods you eat and your eating habits are very important. Choosing the right foods can help ease your discomfort.  WHAT GUIDELINES DO I NEED TO FOLLOW?   Choose fruits, vegetables, whole grains, and low-fat dairy products.   Choose low-fat meat, fish, and poultry.  Limit fats such as oils, salad dressings, butter, nuts, and avocado.   Keep a food diary. This helps you identify foods that cause symptoms.   Avoid foods that cause symptoms. These may be different for everyone.   Eat small meals often instead of 3 large meals a day.   Eat your meals slowly, in  a place where you are relaxed.   Limit fried foods.   Cook foods using methods other than frying.   Avoid drinking alcohol.   Avoid drinking large amounts of liquids with your meals.   Avoid bending over or lying down until 2-3 hours after eating.  WHAT FOODS ARE NOT RECOMMENDED?  These are some foods and drinks that may make your symptoms  worse: Vegetables Tomatoes. Tomato juice. Tomato and spaghetti sauce. Chili peppers. Onion and garlic. Horseradish. Fruits Oranges, grapefruit, and lemon (fruit and juice). Meats High-fat meats, fish, and poultry. This includes hot dogs, ribs, ham, sausage, salami, and bacon. Dairy Whole milk and chocolate milk. Sour cream. Cream. Butter. Ice cream. Cream cheese.  Drinks Coffee and tea. Bubbly (carbonated) drinks or energy drinks. Condiments Hot sauce. Barbecue sauce.  Sweets/Desserts Chocolate and cocoa. Donuts. Peppermint and spearmint. Fats and Oils High-fat foods. This includes [place] fries and potato chips. Other Vinegar. Strong spices. This includes black pepper, white pepper, red pepper, cayenne, curry powder, cloves, ginger, and chili powder. The items listed above may not be a complete list of foods and drinks to avoid. Contact your dietitian for more information.   This information is not intended to replace advice given to you by your health care provider. Make sure you discuss any questions you have with your health care provider.   Document Released: 05/24/2012 Document Revised: 12/14/2014 Document Reviewed: 09/27/2013 Elsevier Interactive Patient Education 2016 [place].  Gastroesophageal Reflux Disease, Adult Normally, food travels down the esophagus and stays in the stomach to be digested. If a person has gastroesophageal reflux disease (GERD), food and stomach acid move back up into the esophagus. When this happens, the esophagus becomes sore and swollen (inflamed). Over time, GERD can make small holes (ulcers) in the lining of the esophagus. HOME CARE Diet  Follow a diet as told by your doctor. You may need to avoid foods and drinks such as:  Coffee and tea (with or without caffeine).  Drinks that contain alcohol.  Energy drinks and sports drinks.  Carbonated drinks or sodas.  Chocolate and cocoa.  Peppermint and mint flavorings.  Garlic and  onions.  Horseradish.  Spicy and acidic foods, such as peppers, chili powder, curry powder, vinegar, hot sauces, and BBQ sauce.  Citrus fruit juices and citrus fruits, such as oranges, lemons, and limes.  Tomato-based foods, such as red sauce, chili, salsa, and pizza with red sauce.  Fried and fatty foods, such as donuts, french fries, potato chips, and high-fat dressings.  High-fat meats, such as hot dogs, rib eye steak, sausage, ham, and bacon.  High-fat dairy items, such as whole milk, butter, and cream cheese.  Eat small meals often. Avoid eating large meals.  Avoid drinking large amounts of liquid with your meals.  Avoid eating meals during the 2-3 hours before bedtime.  Avoid lying down right after you eat.  Do not exercise right after you eat. General Instructions  Pay attention to any changes in your symptoms.  Take over-the-counter and prescription medicines only as told by your doctor. Do not take aspirin, ibuprofen, or other NSAIDs unless your doctor says it is okay.  Do not use any tobacco products, including cigarettes, chewing tobacco, and e-cigarettes. If you need help quitting, ask your doctor.  Wear loose clothes. Do not wear anything tight around your waist.  Raise (elevate) the head of your bed about 6 inches (15 cm).  Try to lower your stress. If you need help doing this, ask  your doctor.  If you are overweight, lose an amount of weight that is healthy for you. Ask your doctor about a safe weight loss goal.  Keep all follow-up visits as told by your doctor. This is important. GET HELP IF:  You have new symptoms.  You lose weight and you do not know why it is happening.  You have trouble swallowing, or it hurts to swallow.  You have wheezing or a cough that keeps happening.  Your symptoms do not get better with treatment.  You have a hoarse voice. GET HELP RIGHT AWAY IF:  You have pain in your arms, neck, jaw, teeth, or back.  You feel  sweaty, dizzy, or light-headed.  You have chest pain or shortness of breath.  You throw up (vomit) and your throw up looks like blood or coffee grounds.  You pass out (faint).  Your poop (stool) is bloody or black.  You cannot swallow, drink, or eat.   This information is not intended to replace advice given to you by your health care provider. Make sure you discuss any questions you have with your health care provider.   Document Released: 05/11/2008 Document Revised: 08/14/2015 Document Reviewed: 03/20/2015 Elsevier Interactive Patient Education 2016 [place].  Diarrhea Diarrhea is watery poop (stool). It can make you feel weak, tired, thirsty, or give you a dry mouth (signs of dehydration). Watery poop is a sign of another problem, most often an infection. It often lasts 2-3 days. It can last longer if it is a sign of something serious. Take care of yourself as told by your doctor. HOME CARE   Drink 1 cup (8 ounces) of fluid each time you have watery poop.  Do not drink the following fluids:  Those that contain simple sugars (fructose, glucose, galactose, lactose, sucrose, maltose).  Sports drinks.  Fruit juices.  Whole milk products.  Sodas.  Drinks with caffeine (coffee, tea, soda) or alcohol.  Oral rehydration solution may be used if the doctor says it is okay. You may make your own solution. Follow this recipe:   - teaspoon table salt.   teaspoon baking soda.   teaspoon salt substitute containing potassium chloride.  1 tablespoons sugar.  1 liter (34 ounces) of water.  Avoid the following foods:  High fiber foods, such as raw fruits and vegetables.  Nuts, seeds, and whole grain breads and cereals.   Those that are sweetened with sugar alcohols (xylitol, sorbitol, mannitol).  Try eating the following foods:  Starchy foods, such as rice, toast, pasta, low-sugar cereal, oatmeal, baked potatoes, crackers, and  bagels.  Bananas.  Applesauce.  Eat probiotic-rich foods, such as yogurt and milk products that are fermented.  Wash your hands well after each time you have watery poop.  Only take medicine as told by your doctor.  Take a warm bath to help lessen burning or pain from having watery poop. GET HELP RIGHT AWAY IF:   You cannot drink fluids without throwing up (vomiting).  You keep throwing up.  You have blood in your poop, or your poop looks black and tarry.  You do not pee (urinate) in 6-8 hours, or there is only a small amount of very dark pee.  You have belly (abdominal) pain that gets worse or stays in the same spot (localizes).  You are weak, dizzy, confused, or light-headed.  You have a very bad headache.  Your watery poop gets worse or does not get better.  You have a fever or lasting  symptoms for more than 2-3 days.  You have a fever and your symptoms suddenly get worse. MAKE SURE YOU:   Understand these instructions.  Will watch your condition.  Will get help right away if you are not doing well or get worse.   This information is not intended to replace advice given to you by your health care provider. Make sure you discuss any questions you have with your health care provider.   Document Released: 05/11/2008 Document Revised: 12/14/2014 Document Reviewed: 07/31/2012 Elsevier Interactive Patient Education 2016 [place].  Diet for Irritable Bowel Syndrome When you have irritable bowel syndrome (IBS), the foods you eat and your eating habits are very important. IBS may cause various symptoms, such as abdominal pain, constipation, or diarrhea. Choosing the right foods can help ease discomfort caused by these symptoms. Work with your health care provider and dietitian to find the best eating plan to help control your symptoms. WHAT GENERAL GUIDELINES DO I NEED TO FOLLOW?  Keep a food diary. This will help you identify foods that cause symptoms. Write  down:  What you eat and when.  What symptoms you have.  When symptoms occur in relation to your meals.  Avoid foods that cause symptoms. Talk with your dietitian about other ways to get the same nutrients that are in these foods.  Eat more foods that contain fiber. Take a fiber supplement if directed by your dietitian.  Eat your meals slowly, in a relaxed setting.  Aim to eat 5-6 small meals per day. Do not skip meals.  Drink enough fluids to keep your urine clear or pale yellow.  Ask your health care provider if you should take an over-the-counter probiotic during flare-ups to help restore healthy gut bacteria.  If you have cramping or diarrhea, try making your meals low in fat and high in carbohydrates. Examples of carbohydrates are pasta, rice, whole grain breads and cereals, fruits, and vegetables.  If dairy products cause your symptoms to flare up, try eating less of them. You might be able to handle yogurt better than other dairy products because it contains bacteria that help with digestion. WHAT FOODS ARE NOT RECOMMENDED? The following are some foods and drinks that may worsen your symptoms:  Fatty foods, such as [place] fries.  Milk products, such as cheese or ice cream.  Chocolate.  Alcohol.  Products with caffeine, such as coffee.  Carbonated drinks, such as soda. The items listed above may not be a complete list of foods and beverages to avoid. Contact your dietitian for more information. WHAT FOODS ARE GOOD SOURCES OF FIBER? Your health care provider or dietitian may recommend that you eat more foods that contain fiber. Fiber can help reduce constipation and other IBS symptoms. Add foods with fiber to your diet a little at a time so that your body can get used to them. Too much fiber at once might cause gas and swelling of your abdomen. The following are some foods that are good sources of fiber:  Apples.  Peaches.  Pears.  Berries.  Figs.  Broccoli  (raw).  Cabbage.  Carrots.  Raw peas.  Kidney beans.  Lima beans.  Whole grain bread.  Whole grain cereal. FOR MORE INFORMATION  International Foundation for Functional Gastrointestinal Disorders: www.iffgd.[employer] of Diabetes and Digestive and Kidney Diseases: NetworkAff[employer].co.za.aspx   This information is not intended to replace advice given to you by your health care provider. Make sure you discuss any questions you have with your  health care provider.   Document Released: 02/13/2004 Document Revised: 12/14/2014 Document Reviewed: 02/23/2014 Elsevier Interactive Patient Education [employer].

## 2016-05-20 NOTE — ED Notes (Signed)
Patient c/o central chest pain that started yestserday at 1400. Patient states she "feels like I can not catch my breath at times."  Patient also c/o joint pain, and back pain.

## 2018-05-07 ENCOUNTER — Emergency Department (HOSPITAL_COMMUNITY)
Admission: EM | Admit: 2018-05-07 | Discharge: 2018-05-07 | Disposition: A | Discharge status: Home / Self Care | Payer: BLUE CROSS/BLUE SHIELD | Attending: Emergency Medicine | Admitting: Emergency Medicine

## 2018-05-07 ENCOUNTER — Emergency Department (HOSPITAL_COMMUNITY): Payer: BLUE CROSS/BLUE SHIELD

## 2018-05-07 ENCOUNTER — Other Ambulatory Visit: Payer: Self-pay

## 2018-05-07 ENCOUNTER — Encounter (HOSPITAL_COMMUNITY): Payer: Self-pay | Admitting: *Deleted

## 2018-05-07 DIAGNOSIS — F17210 Nicotine dependence, cigarettes, uncomplicated: Secondary | ICD-10-CM | POA: Insufficient documentation

## 2018-05-07 DIAGNOSIS — M25562 Pain in left knee: Secondary | ICD-10-CM

## 2018-05-07 MED ORDER — HYDROCODONE-ACETAMINOPHEN 5-325 MG PO TABS: 1.0000 | ORAL_TABLET | ORAL | 0 refills | Status: DC | PRN

## 2018-05-07 NOTE — ED Triage Notes (Signed)
Left knee pain without known cause, pain has been going and coming, now pain with knee popping when walking

## 2018-05-07 NOTE — ED Provider Notes (Signed)
[facility] DEPT Provider Note   CSN: [id number] Arrival date & time: 05/07/18  0867     History   Chief Complaint Chief Complaint  Patient presents with  . Knee Pain    Left    HPI [name] is a 42 y.o. female who presents with left knee pain.  No significant past medical history.  She states that over the past month she has had gradually worsening left knee pain and swelling.  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  She has had clicking and popping in the knee as well as instability but has not had any falls.  She is been elevating it and using a knee brace.  The pain is typically worse at night when she is been on her feet all day.  She has been taking ibuprofen for pain.  The pain is mostly over the kneecap and the medial aspect of the knee.  She denies history of prior episodes of knee pain or knee surgeries.  HPI  Past Medical History:  Diagnosis Date  . Anxiety   . C. difficile diarrhea    Sept 2014  . Depression   . Endometriosis   . Hypertension   . Ovarian cyst     Patient Active Problem List   Diagnosis Date Noted  . Nonspecific (abnormal) findings on radiological and other examination of gastrointestinal tract 09/21/2013  . Clostridium difficile colitis 08/18/2013    Past Surgical History:  Procedure Laterality Date  . COLONOSCOPY WITH PROPOFOL N/A 09/21/2013   Procedure: COLONOSCOPY WITH PROPOFOL;  Surgeon: Milus Banister, MD;  Location: [facility] ENDOSCOPY;  Service: Endoscopy;  Laterality: N/A;  . OVARIAN CYST REMOVAL       OB History    Gravida  2   Para  1   Term  1   Preterm  0   AB  1   Living  1     SAB  1   TAB  0   Ectopic  0   Multiple  0   Live Births               Home Medications    Prior to Admission medications   Medication Sig Start Date End Date Taking? Authorizing Provider  aspirin 325 MG tablet Take 325 mg by mouth  every 6 (six) hours as needed for mild pain.    [provider]  doxycycline (VIBRAMYCIN) 100 MG capsule Take 1 capsule (100 mg total) by mouth 2 (two) times daily. Patient not taking: Reported on 03/30/2015 07/07/14   Shawnee Knapp, MD  EPINEPHrine (EPIPEN) 0.3 mg/0.3 mL SOAJ injection Inject into the muscle once as needed (allergic reaction).    [provider]  HYDROcodone-acetaminophen (NORCO/VICODIN) 5-325 MG tablet Take 1 tablet by mouth every 4 (four) hours as needed. 05/07/18   Recardo Evangelist, PA-C  hydrocortisone-pramoxine [facility]) 2.5-1 % rectal cream Place rectally 2 (two) times daily. Patient not taking: Reported on 03/30/2015 10/04/13   Milus Banister, MD  hyoscyamine (LEVSIN SL) 0.125 MG SL tablet Place 1 tablet (0.125 mg total) under the tongue every 4 (four) hours as needed for cramping. Patient not taking: Reported on 03/30/2015 09/26/13   Milus Banister, MD  ibuprofen (ADVIL,MOTRIN) 200 MG tablet Take 400 mg by mouth every 6 (six) hours as needed for mild pain.    [provider]  metroNIDAZOLE (FLAGYL) 500 MG tablet Take  1 tablet (500 mg total) by mouth 3 (three) times daily. Patient not taking: Reported on 05/20/2016 03/30/15   Charlesetta Shanks, MD  oxyCODONE-acetaminophen (PERCOCET/ROXICET) 5-325 MG per tablet Take 1-2 tablets by mouth every 4 (four) hours as needed for severe pain. Patient not taking: Reported on 05/20/2016 03/30/15   Charlesetta Shanks, MD  oxyCODONE-acetaminophen (ROXICET) 5-325 MG per tablet Take 1 tablet by mouth every 8 (eight) hours as needed for severe pain. Patient not taking: Reported on 03/30/2015 07/07/14   Shawnee Knapp, MD  ranitidine (ZANTAC) 150 MG tablet Take 150 mg by mouth daily as needed for heartburn.    [provider]  ranitidine (ZANTAC) 150 MG tablet Take 1 tablet (150 mg total) by mouth 2 (two) times daily. 05/20/16   Street, [place], PA-C    Family History Family History  Problem Relation Age of Onset    . Alzheimer's disease Mother   . Heart disease Father   . COPD Father     Social History Social History   Tobacco Use  . Smoking status: Current Every Day Smoker    Packs/day: 0.50    Types: Cigarettes    Last attempt to quit: 02/04/2013    Years since quitting: 5.2  . Smokeless tobacco: Never Used  Substance Use Topics  . Alcohol use: Yes    Alcohol/week: 2.4 oz    Types: 4 Glasses of wine per week    Comment: 2-3 times a week  . Drug use: No     Allergies   Bee venom   Review of Systems Review of Systems  Musculoskeletal: Positive for arthralgias and joint swelling.  Neurological: Negative for weakness and numbness.     Physical Exam Updated Vital Signs BP (!) 151/94 (BP Location: Left Arm)   Pulse 88   Temp 98.4 F (36.9 C) (Oral)   Resp 18   Ht 5\' 4"  (1.626 m)   Wt 77.1 kg (170 lb)   LMP 04/12/2018 (Approximate)   SpO2 98%   BMI 29.18 kg/m   Physical Exam  Constitutional: She is oriented to person, place, and time. She appears well-developed and well-nourished. No distress.  HENT:  Head: Normocephalic and atraumatic.  Eyes: Pupils are equal, round, and reactive to light. Conjunctivae are normal. Right eye exhibits no discharge. Left eye exhibits no discharge. No scleral icterus.  Neck: Normal range of motion.  Cardiovascular: Normal rate.  Pulmonary/Chest: Effort normal. No respiratory distress.  Abdominal: She exhibits no distension.  Musculoskeletal:  Left knee: Moderate joint effusion.  No redness or warmth.  Tenderness over the medial joint line and patella.  Decreased range of motion due to the pain and swelling. She is unable to tolerate McMurray's test. Negative A-P drawer test. No laxity with varus/valgus stress  Neurological: She is alert and oriented to person, place, and time.  Skin: Skin is warm and dry.  Psychiatric: She has a normal mood and affect. Her behavior is normal.  Nursing note and vitals reviewed.    ED Treatments /  Results  Labs (all labs ordered are listed, but only abnormal results are displayed) Labs Reviewed - No data to display  EKG None  Radiology Dg Knee Complete 4 Views Left  Result Date: 05/07/2018 CLINICAL DATA:  Left knee pain.  No injury. EXAM: LEFT KNEE - COMPLETE 4+ VIEW COMPARISON:  None. FINDINGS: No fracture.  No bone lesion. Knee joint normally spaced and aligned. Small joint effusion. Soft tissues are unremarkable. IMPRESSION: 1. No fracture or arthropathic/degenerative  change. 2. Small joint effusion. Electronically Signed   By: Lajean Manes M.D.   On: 05/07/2018 09:29    Procedures Procedures (including critical care time)  Medications Ordered in ED Medications - No data to display   Initial Impression / Assessment and Plan / ED Course  I have reviewed the triage vital signs and the nursing notes.  Pertinent labs & imaging results that were available during my care of the patient were reviewed by me and considered in my medical decision making (see chart for details).  42 year old female presents with acute left knee pain. Likely meniscus tear vs ligament tear. She is mildly hypertensive but otherwise vitals are normal. Xray is remarkable for joint effusion without bony abnormality. Exam is not consistent with septic joint. Advised RICE, NSAIDs, and will give pain control and have her f/u with ortho.  Final Clinical Impressions(s) / ED Diagnoses   Final diagnoses:  Acute pain of left knee    ED Discharge Orders        Ordered    HYDROcodone-acetaminophen (NORCO/VICODIN) 5-325 MG tablet  Every 4 hours PRN     05/07/18 0943       Recardo Evangelist, PA-C 05/07/18 6060    Carmin Muskrat, MD 05/07/18 1535

## 2018-05-07 NOTE — Discharge Instructions (Addendum)
Rest - please stay off left leg as much as possible Ice - ice for 20 minutes at a time, several times a day Compression - wear brace to provide support Elevate - elevate left leg above level of heart Ibuprofen - take with food. Take up to 3-4 times daily Take Norco for severe pain at night. This medicine can make you sleepy Follow up with orthopedics

## 2019-09-01 ENCOUNTER — Other Ambulatory Visit: Payer: Self-pay

## 2019-09-01 ENCOUNTER — Emergency Department (HOSPITAL_COMMUNITY): Payer: Self-pay

## 2019-09-01 ENCOUNTER — Emergency Department (HOSPITAL_COMMUNITY)
Admission: EM | Admit: 2019-09-01 | Discharge: 2019-09-01 | Disposition: A | Discharge status: Home / Self Care | Payer: Self-pay | Attending: Emergency Medicine | Admitting: Emergency Medicine

## 2019-09-01 ENCOUNTER — Encounter (HOSPITAL_COMMUNITY): Payer: Self-pay | Admitting: Emergency Medicine

## 2019-09-01 DIAGNOSIS — S76001A Unspecified injury of muscle, fascia and tendon of right hip, initial encounter: Secondary | ICD-10-CM | POA: Insufficient documentation

## 2019-09-01 DIAGNOSIS — F17210 Nicotine dependence, cigarettes, uncomplicated: Secondary | ICD-10-CM | POA: Insufficient documentation

## 2019-09-01 DIAGNOSIS — S76011A Strain of muscle, fascia and tendon of right hip, initial encounter: Secondary | ICD-10-CM

## 2019-09-01 DIAGNOSIS — Z79899 Other long term (current) drug therapy: Secondary | ICD-10-CM | POA: Insufficient documentation

## 2019-09-01 DIAGNOSIS — M25551 Pain in right hip: Secondary | ICD-10-CM

## 2019-09-01 DIAGNOSIS — Y929 Unspecified place or not applicable: Secondary | ICD-10-CM | POA: Insufficient documentation

## 2019-09-01 DIAGNOSIS — I10 Essential (primary) hypertension: Secondary | ICD-10-CM | POA: Insufficient documentation

## 2019-09-01 DIAGNOSIS — Y999 Unspecified external cause status: Secondary | ICD-10-CM | POA: Insufficient documentation

## 2019-09-01 DIAGNOSIS — Z7982 Long term (current) use of aspirin: Secondary | ICD-10-CM | POA: Insufficient documentation

## 2019-09-01 DIAGNOSIS — Y93K1 Activity, walking an animal: Secondary | ICD-10-CM | POA: Insufficient documentation

## 2019-09-01 DIAGNOSIS — W19XXXA Unspecified fall, initial encounter: Secondary | ICD-10-CM | POA: Insufficient documentation

## 2019-09-01 MED ORDER — OXYCODONE-ACETAMINOPHEN 5-325 MG PO TABS: 1.0000 | ORAL_TABLET | ORAL | 0 refills | Status: AC | PRN

## 2019-09-01 MED ORDER — OXYCODONE-ACETAMINOPHEN 5-325 MG PO TABS
1.0000 | ORAL_TABLET | Freq: Once | ORAL | Status: AC
  Administered 2019-09-01: 16:00:00 1 via ORAL
  Filled 2019-09-01: qty 1

## 2019-09-01 NOTE — ED Provider Notes (Signed)
[facility] DEPT Provider Note   CSN: [id number] Arrival date & time: 09/01/19  1327     History   Chief Complaint Chief Complaint  Patient presents with  . Hip Pain    HPI [name] is a 43 y.o. female.     HPI   43 year old female with right hip pain.  Onset Monday.  Patient was pulled down by dog when she was walking down a wet hill.  She said severe and persistent right hip pain since that time.  She initially thought that the pain may improve but has not at this point.  Pain is markedly worse with ambulation.  She feels like she cannot bear full weight on it.  Denies any other acute injuries.  She has been taking ibuprofen with minimal improvement.  No numbness or tingling.  Past Medical History:  Diagnosis Date  . Anxiety   . C. difficile diarrhea    Sept 2014  . Depression   . Endometriosis   . Hypertension   . Ovarian cyst     Patient Active Problem List   Diagnosis Date Noted  . Nonspecific (abnormal) findings on radiological and other examination of gastrointestinal tract 09/21/2013  . Clostridium difficile colitis 08/18/2013    Past Surgical History:  Procedure Laterality Date  . COLONOSCOPY WITH PROPOFOL N/A 09/21/2013   Procedure: COLONOSCOPY WITH PROPOFOL;  Surgeon: Milus Banister, MD;  Location: [facility] ENDOSCOPY;  Service: Endoscopy;  Laterality: N/A;  . OVARIAN CYST REMOVAL       OB History    Gravida  2   Para  1   Term  1   Preterm  0   AB  1   Living  1     SAB  1   TAB  0   Ectopic  0   Multiple  0   Live Births               Home Medications    Prior to Admission medications   Medication Sig Start Date End Date Taking? Authorizing Provider  aspirin 325 MG tablet Take 325 mg by mouth every 6 (six) hours as needed for mild pain.    [provider]  doxycycline (VIBRAMYCIN) 100 MG capsule Take 1 capsule (100 mg total) by mouth 2 (two) times daily. Patient not taking: Reported  on 03/30/2015 07/07/14   Shawnee Knapp, MD  EPINEPHrine (EPIPEN) 0.3 mg/0.3 mL SOAJ injection Inject into the muscle once as needed (allergic reaction).    [provider]  HYDROcodone-acetaminophen (NORCO/VICODIN) 5-325 MG tablet Take 1 tablet by mouth every 4 (four) hours as needed. 05/07/18   Recardo Evangelist, PA-C  hydrocortisone-pramoxine [facility] 2) 2.5-1 % rectal cream Place rectally 2 (two) times daily. Patient not taking: Reported on 03/30/2015 10/04/13   Milus Banister, MD  hyoscyamine (LEVSIN SL) 0.125 MG SL tablet Place 1 tablet (0.125 mg total) under the tongue every 4 (four) hours as needed for cramping. Patient not taking: Reported on 03/30/2015 09/26/13   Milus Banister, MD  ibuprofen (ADVIL,MOTRIN) 200 MG tablet Take 400 mg by mouth every 6 (six) hours as needed for mild pain.    [provider]  metroNIDAZOLE (FLAGYL) 500 MG tablet Take 1 tablet (500 mg total) by mouth 3 (three) times daily. Patient not taking: Reported on 05/20/2016 03/30/15   Charlesetta Shanks, MD  oxyCODONE-acetaminophen (PERCOCET/ROXICET) 5-325 MG per tablet Take 1-2 tablets by mouth every 4 (four) hours as needed for  severe pain. Patient not taking: Reported on 05/20/2016 03/30/15   Charlesetta Shanks, MD  oxyCODONE-acetaminophen (ROXICET) 5-325 MG per tablet Take 1 tablet by mouth every 8 (eight) hours as needed for severe pain. Patient not taking: Reported on 03/30/2015 07/07/14   Shawnee Knapp, MD  ranitidine (ZANTAC) 150 MG tablet Take 150 mg by mouth daily as needed for heartburn.    [provider]  ranitidine (ZANTAC) 150 MG tablet Take 1 tablet (150 mg total) by mouth 2 (two) times daily. 05/20/16   Street, [place], PA-C    Family History Family History  Problem Relation Age of Onset  . Alzheimer's disease Mother   . Heart disease Father   . COPD Father     Social History Social History   Tobacco Use  . Smoking status: Current Every Day Smoker    Packs/day: 0.50    Types:  Cigarettes    Last attempt to quit: 02/04/2013    Years since quitting: 6.5  . Smokeless tobacco: Never Used  Substance Use Topics  . Alcohol use: Yes    Alcohol/week: 4.0 standard drinks    Types: 4 Glasses of wine per week    Comment: 2-3 times a week  . Drug use: No     Allergies   Bee venom   Review of Systems Review of Systems  All systems reviewed and negative, other than as noted in HPI.  Physical Exam Updated Vital Signs BP (!) 166/95 (BP Location: Left Arm)   Pulse 73   Temp 98.6 F (37 C) (Oral)   Resp 17   SpO2 100%   Physical Exam Vitals signs and nursing note reviewed.  Constitutional:      General: She is not in acute distress.    Appearance: She is well-developed.  HENT:     Head: Normocephalic and atraumatic.  Eyes:     General:        Right eye: No discharge.        Left eye: No discharge.     Conjunctiva/sclera: Conjunctivae normal.  Neck:     Musculoskeletal: Neck supple.  Cardiovascular:     Rate and Rhythm: Normal rate and regular rhythm.     Heart sounds: Normal heart sounds. No murmur. No friction rub. No gallop.   Pulmonary:     Effort: Pulmonary effort is normal. No respiratory distress.     Breath sounds: Normal breath sounds.  Abdominal:     General: There is no distension.     Palpations: Abdomen is soft.     Tenderness: There is no abdominal tenderness.  Musculoskeletal:        General: No swelling.     Comments: No shortening or malrotation on the right leg as compared to the left.  Pain with right hip flexion internal rotation.  Neurovascular intact.  Skin:    General: Skin is warm and dry.  Neurological:     Mental Status: She is alert.  Psychiatric:        Behavior: Behavior normal.        Thought Content: Thought content normal.      ED Treatments / Results  Labs (all labs ordered are listed, but only abnormal results are displayed) Labs Reviewed - No data to display  EKG None  Radiology No results found.   Procedures Procedures (including critical care time)  Medications Ordered in ED Medications  oxyCODONE-acetaminophen (PERCOCET/ROXICET) 5-325 MG per tablet 1 tablet (has no administration in time range)  Initial Impression / Assessment and Plan / ED Course  I have reviewed the triage vital signs and the nursing notes.  Pertinent labs & imaging results that were available during my care of the patient were reviewed by me and considered in my medical decision making (see chart for details).       43 year old female with traumatic right hip pain.  Currently bear partial weight on it.  We will initially obtain plain films.  Pain medicine.    Final Clinical Impressions(s) / ED Diagnoses   Final diagnoses:  Right hip pain    ED Discharge Orders    None       Virgel Manifold, MD 09/03/19 [phone]

## 2019-09-01 NOTE — ED Triage Notes (Signed)
Pt reports fell on Monday and having right hip pains esp with movement.

## 2019-09-01 NOTE — ED Provider Notes (Signed)
Signout  43 year old lady presents the ER with right hip pain after fall on Monday.  X-ray ordered pending at time of signout.  3:00 PM received signout from Dr. Wilson Singer  4:00 PM recheck patient, updated on results, negative hip x-ray, patient ambulated in room without significant difficulty, given ambulating without difficulty at this time and negative x-ray, very low suspicion for occult hip fracture, provided information for orthopedic referral, recommended recheck next week, reviewed return precautions, provided short Rx for Percocet, will discharge home   Lucrezia Starch, MD 09/01/19 1649

## 2019-09-01 NOTE — Discharge Instructions (Addendum)
Recommend taking Tylenol, Motrin for pain control.  Can use the prescribed Percocet as needed for breakthrough pain.  Please call the orthopedic doctor to schedule follow-up appointment for recheck next week.  Recommend bearing weight as tolerated.  If you feel the pain is worsening and you are unable to bear weight, then I recommend returning to the ER for reassessment.

## 2019-09-01 NOTE — ED Notes (Signed)
Transported to X-ray

## 2022-05-02 ENCOUNTER — Emergency Department (HOSPITAL_COMMUNITY)
Admission: EM | Admit: 2022-05-02 | Discharge: 2022-05-02 | Disposition: A | Discharge status: Home / Self Care | Payer: Self-pay | Attending: Emergency Medicine | Admitting: Emergency Medicine

## 2022-05-02 ENCOUNTER — Encounter (HOSPITAL_COMMUNITY): Payer: Self-pay | Admitting: *Deleted

## 2022-05-02 ENCOUNTER — Emergency Department (HOSPITAL_COMMUNITY): Payer: Self-pay

## 2022-05-02 ENCOUNTER — Other Ambulatory Visit: Payer: Self-pay

## 2022-05-02 DIAGNOSIS — I10 Essential (primary) hypertension: Secondary | ICD-10-CM | POA: Insufficient documentation

## 2022-05-02 DIAGNOSIS — R079 Chest pain, unspecified: Secondary | ICD-10-CM | POA: Insufficient documentation

## 2022-05-02 DIAGNOSIS — R1084 Generalized abdominal pain: Secondary | ICD-10-CM | POA: Insufficient documentation

## 2022-05-02 LAB — BASIC METABOLIC PANEL
Anion gap: 7 (ref 5–15)
BUN: 10 mg/dL (ref 6–20)
CO2: 20 mmol/L — ABNORMAL LOW (ref 22–32)
Calcium: 9.1 mg/dL (ref 8.9–10.3)
Chloride: 109 mmol/L (ref 98–111)
Creatinine, Ser: 0.71 mg/dL (ref 0.44–1.00)
GFR, Estimated: >60 mL/min (ref >60)
Glucose, Bld: 116 mg/dL — ABNORMAL HIGH (ref 70–99)
Potassium: 4.2 mmol/L (ref 3.5–5.1)
Sodium: 136 mmol/L (ref 135–145)

## 2022-05-02 LAB — CBC
HCT: 46.8 % — ABNORMAL HIGH (ref 36.0–46.0)
Hemoglobin: 16.2 g/dL — ABNORMAL HIGH (ref 12.0–15.0)
MCH: 32 pg (ref 26.0–34.0)
MCHC: 34.6 g/dL (ref 30.0–36.0)
MCV: 92.3 fL (ref 80.0–100.0)
Platelets: 297 10*3/uL (ref 150–400)
RBC: 5.07 MIL/uL (ref 3.87–5.11)
RDW: 13.1 % (ref 11.5–15.5)
WBC: 9.2 10*3/uL (ref 4.0–10.5)
nRBC: 0 % (ref 0.0–0.2)

## 2022-05-02 LAB — TROPONIN I (HIGH SENSITIVITY)
Troponin I (High Sensitivity): 2 ng/L (ref <18)
Troponin I (High Sensitivity): <2 ng/L (ref <18)

## 2022-05-02 LAB — LIPASE, BLOOD: Lipase: 27 U/L (ref 11–51)

## 2022-05-02 LAB — I-STAT BETA HCG BLOOD, ED (MC, WL, AP ONLY): I-stat hCG, quantitative: <5 m[IU]/mL (ref <5)

## 2022-05-02 MED ORDER — MORPHINE SULFATE (PF) 2 MG/ML IV SOLN
1.0000 mg | Freq: Once | INTRAVENOUS | Status: AC
  Administered 2022-05-02: 1 mg via INTRAVENOUS
  Filled 2022-05-02: qty 1

## 2022-05-02 MED ORDER — AMLODIPINE BESYLATE 5 MG PO TABS
5.0000 mg | ORAL_TABLET | Freq: Once | ORAL | Status: AC
Start: 2022-05-02 — End: 2022-05-02
  Administered 2022-05-02: 5 mg via ORAL
  Filled 2022-05-02: qty 1

## 2022-05-02 MED ORDER — LIDOCAINE VISCOUS HCL 2 % MT SOLN
15.0000 mL | Freq: Once | OROMUCOSAL | Status: AC
  Administered 2022-05-02: 15 mL via ORAL
  Filled 2022-05-02: qty 15

## 2022-05-02 MED ORDER — ALUM & MAG HYDROXIDE-SIMETH 200-200-20 MG/5ML PO SUSP
30.0000 mL | Freq: Once | ORAL | Status: AC
  Administered 2022-05-02: 30 mL via ORAL
  Filled 2022-05-02: qty 30

## 2022-05-02 MED ORDER — SODIUM CHLORIDE 0.9 % IV BOLUS
1000.0000 mL | Freq: Once | INTRAVENOUS | Status: AC
  Administered 2022-05-02: 1000 mL via INTRAVENOUS

## 2022-05-02 NOTE — ED Triage Notes (Signed)
Pt present with 3 nights of chest/epigastric pain going to back between shoulder blades and can not get relief at times. Worse lying flat and after eating

## 2022-05-02 NOTE — ED Provider Notes (Addendum)
[facility] DEPT Provider Note   CSN: [id number] Arrival date & time: 05/02/22  0849     History  Chief Complaint  Patient presents with   Chest Pain    [name] is a 46 y.o. female.  Patient presented to the hospital with a chief complaint of chest pain.  Patient also complained of pain between her shoulder blades, initially generalized abdominal pain, but later clarified to left upper quadrant and suprapubic pain.  Patient denies nausea and vomiting.  Denies shortness of breath.  Patient states that the symptoms of been worse for 3 to 4 days.  She states that the overall symptoms have been ongoing for approximately 7 months since she has been living with the pain.  She denies dysuria.  Nothing seems to make the chest pain better or worse.  She has taken no medication at home.  Past medical history significant for ovarian cyst, endometriosis, anxiety, hypertension  HPI     Home Medications Prior to Admission medications   Medication Sig Start Date End Date Taking? Authorizing Provider  famotidine (PEPCID) 20 MG tablet Take 20 mg by mouth daily as needed for heartburn or indigestion.   Yes [provider]  FIBER PO Take 2 tablets by mouth 3 (three) times daily as needed (stomach issues/pain).   Yes [provider]  ibuprofen (ADVIL,MOTRIN) 200 MG tablet Take 400 mg by mouth 2 (two) times daily as needed (pain).   Yes [provider]  MAGNESIUM PO Take 1 tablet by mouth daily.   Yes [provider]  raNITIdine HCl (ZANTAC PO) Take 1 tablet by mouth daily as needed (Indegestion).   Yes [provider]  doxycycline (VIBRAMYCIN) 100 MG capsule Take 1 capsule (100 mg total) by mouth 2 (two) times daily. Patient not taking: Reported on 03/30/2015 07/07/14   Shawnee Knapp, MD  EPINEPHrine 0.3 mg/0.3 mL IJ SOAJ injection Inject into the muscle once as needed (allergic reaction). Patient not taking: Reported on 05/02/2022     [provider]  hydrocortisone-pramoxine [facility]) 2.5-1 % rectal cream Place rectally 2 (two) times daily. Patient not taking: Reported on 05/02/2022 10/04/13   Milus Banister, MD  metroNIDAZOLE (FLAGYL) 500 MG tablet Take 1 tablet (500 mg total) by mouth 3 (three) times daily. Patient not taking: Reported on 05/20/2016 03/30/15   Charlesetta Shanks, MD  oxyCODONE-acetaminophen (ROXICET) 5-325 MG per tablet Take 1 tablet by mouth every 8 (eight) hours as needed for severe pain. Patient not taking: Reported on 03/30/2015 07/07/14   Shawnee Knapp, MD      Allergies    Bee venom and Wasp venom    Review of Systems   Review of Systems  Constitutional:  Negative for fever.  Respiratory:  Negative for cough and shortness of breath.   Cardiovascular:  Positive for chest pain.  Gastrointestinal:  Positive for abdominal pain. Negative for nausea and vomiting.  Genitourinary:  Negative for dysuria.  Musculoskeletal:  Positive for back pain.   Physical Exam Updated Vital Signs BP 136/89   Pulse 65   Temp 98 F (36.7 C) (Oral)   Resp 17   Ht '5\' 4"'$  (1.626 m)   Wt 63.5 kg   LMP 04/23/2022 (Approximate)   SpO2 98%   BMI 24.03 kg/m  Physical Exam Vitals and nursing note reviewed.  Constitutional:      Appearance: She is normal weight.  HENT:     Head: Normocephalic.  Eyes:  Extraocular Movements: Extraocular movements intact.  Cardiovascular:     Rate and Rhythm: Normal rate and regular rhythm.     Heart sounds: Normal heart sounds.  Pulmonary:     Effort: Pulmonary effort is normal. No respiratory distress.     Breath sounds: Normal breath sounds.  Abdominal:     Palpations: Abdomen is soft.     Tenderness: There is abdominal tenderness in the suprapubic area and left upper quadrant. There is no right CVA tenderness or left CVA tenderness. Negative signs include Murphy's sign and McBurney's sign.     Comments: Initial exam had generalized tenderness with increase in  severity and epigastric and lower left quadrant regions.  Repeat exam had tenderness in left upper quadrant and suprapubic region with no other tenderness.  Musculoskeletal:     Cervical back: Normal range of motion and neck supple.     Right lower leg: No edema.     Left lower leg: No edema.  Skin:    General: Skin is warm and dry.  Neurological:     Mental Status: She is alert.    ED Results / Procedures / Treatments   Labs (all labs ordered are listed, but only abnormal results are displayed) Labs Reviewed  BASIC METABOLIC PANEL - Abnormal; Notable for the following components:      Result Value   CO2 20 (*)    Glucose, Bld 116 (*)    All other components within normal limits  CBC - Abnormal; Notable for the following components:   Hemoglobin 16.2 (*)    HCT 46.8 (*)    All other components within normal limits  LIPASE, BLOOD  I-STAT BETA HCG BLOOD, ED ([facility], [facility], [facility] ONLY)  TROPONIN I (HIGH SENSITIVITY)  TROPONIN I (HIGH SENSITIVITY)    EKG EKG Interpretation  Date/Time:  Saturday May 02 2022 08:56:02 EDT Ventricular Rate:  93 PR Interval:  126 QRS Duration: 93 QT Interval:  340 QTC Calculation: 423 R Axis:   81 Text Interpretation: Sinus rhythm Probable left atrial enlargement Probable left ventricular hypertrophy Confirmed by Thamas Jaegers ([id number]) on 05/02/2022 10:14:21 AM  Radiology DG Chest 2 View  Result Date: 05/02/2022 CLINICAL DATA:  Chest pain. EXAM: CHEST - 2 VIEW COMPARISON:  Chest radiographs May 20, 2016. FINDINGS: No consolidation. No visible pleural effusions or pneumothorax. Cardiomediastinal silhouette is within normal limits. No acute osseous abnormality. Mild reverse S-shaped thoracolumbar curvature. IMPRESSION: No evidence of acute cardiopulmonary disease. Electronically Signed   By: Margaretha Sheffield M.D.   On: 05/02/2022 09:39    Procedures Procedures    Medications Ordered in ED Medications  sodium chloride 0.9 % bolus 1,000 mL (0 mLs  Intravenous Stopped 05/02/22 1121)  morphine (PF) 2 MG/ML injection 1 mg (1 mg Intravenous Given 05/02/22 1007)  alum & mag hydroxide-simeth (MAALOX/MYLANTA) 200-200-20 MG/5ML suspension 30 mL (30 mLs Oral Given 05/02/22 1006)    And  lidocaine (XYLOCAINE) 2 % viscous mouth solution 15 mL (15 mLs Oral Given 05/02/22 1006)  amLODipine (NORVASC) tablet 5 mg (5 mg Oral Given 05/02/22 1044)    ED Course/ Medical Decision Making/ A&P                           Medical Decision Making Amount and/or Complexity of Data Reviewed Labs: ordered. Radiology: ordered.  Risk OTC drugs. Prescription drug management.   This patient presents to the ED for concern of chest pain, this involves an extensive number  of treatment options, and is a complaint that carries with it a high risk of complications and morbidity.  The differential diagnosis includes ACS, PE, dissection, pneumonia, and others  She also has complaints of abdominal pain with a differential including cholecystitis, appendicitis, diverticulitis, pancreatitis, and others   Co morbidities that complicate the patient evaluation  Anxiety, hypertension, depression, endometriosis, ovarian cyst   Lab Tests:  I Ordered, and personally interpreted labs.  The pertinent results include: Initial and repeat troponin of 2, hemoglobin 16.2, lipase 27, i-STAT beta-hCG 3, CO2 20, glucose 116   Imaging Studies ordered:  I ordered imaging studies including chest x-ray I independently visualized and interpreted imaging which showed no evidence of acute cardiopulmonary disease I agree with the radiologist interpretation   Cardiac Monitoring: / EKG:  The patient was maintained on a cardiac monitor.  I personally viewed and interpreted the cardiac monitored which showed an underlying rhythm of: Sinus rhythm   Problem List / ED Course / Critical interventions / Medication management  I ordered medication including morphine for pain, GI cocktail for  reflux-like symptoms, Norvasc for hypertension Reevaluation of the patient after these medicines showed that the patient improved I have reviewed the patients home medicines and have made adjustments as needed   Social Determinants of Health:  Patient has no health insurance   Test / Admission - Considered:  The patient's EKG shows no acute ischemic changes, troponins show no signs of cardiac ischemia.  ACS unlikely.  The patient is not tachycardic and is not short of breath.  Very low suspicion of PE.  The patient's pain has been ongoing for 7 months with worsening the past 3 to 4 days, dissection unlikely but cannot rule out.  Patient's lipase does not support pancreatitis.  No right upper quadrant tenderness, cholecystitis unlikely.  No right lower quadrant tenderness, appendicitis unlikely.     Upon reassessment with the patient I was discussing possible causes of her pain which has been ruled out.  The patient became visibly upset and stated that she felt dismissed. She stated that she didn't have health insurance and would "go home and suffer".  I told her that I was considering an abdominal CT and was not discharging her at the time. She states she would not want an abdominal CT.  She wanted to go home and did not want to stay in the ER.  Unable at this time to provide underlying cause of her abdominal, chest, or back/shoulder pain. Patient states "at least I'm not going to die". Patient discharged at her insistence   Final Clinical Impression(s) / ED Diagnoses Final diagnoses:  Chest pain, unspecified type  Generalized abdominal pain    Rx / DC Orders ED Discharge Orders     None         Ronny Bacon 05/02/22 1242    Ronny Bacon 05/02/22 1315    Luna Fuse, MD 05/12/22 1542

## 2022-05-02 NOTE — ED Notes (Addendum)
Pt said, "At least I know I'm not going to die. I have a 47 year old son. It's hard. I don't have insurance. I hope you have a much better day than me." Pt turned and walked out without signing for discharge paperwork.

## 2022-05-02 NOTE — Discharge Instructions (Addendum)
You were seen today for chest pain and abdominal pain. EKG and troponin testing show no sign of heart attack. Blood work shows no signs of infection and shows no pancreatitis.

## 2022-05-03 ENCOUNTER — Encounter (HOSPITAL_COMMUNITY): Payer: Self-pay

## 2022-05-03 ENCOUNTER — Emergency Department (HOSPITAL_COMMUNITY): Payer: Self-pay

## 2022-05-03 ENCOUNTER — Emergency Department (HOSPITAL_COMMUNITY)
Admission: EM | Admit: 2022-05-03 | Discharge: 2022-05-03 | Disposition: A | Discharge status: Home / Self Care | Payer: Self-pay | Attending: Emergency Medicine | Admitting: Emergency Medicine

## 2022-05-03 ENCOUNTER — Other Ambulatory Visit: Payer: Self-pay

## 2022-05-03 DIAGNOSIS — K8050 Calculus of bile duct without cholangitis or cholecystitis without obstruction: Secondary | ICD-10-CM | POA: Insufficient documentation

## 2022-05-03 LAB — CBC WITH DIFFERENTIAL/PLATELET
Abs Immature Granulocytes: 0.03 10*3/uL (ref 0.00–0.07)
Basophils Absolute: 0.1 10*3/uL (ref 0.0–0.1)
Basophils Relative: 1 %
Eosinophils Absolute: 0.1 10*3/uL (ref 0.0–0.5)
Eosinophils Relative: 1 %
HCT: 44.8 % (ref 36.0–46.0)
Hemoglobin: 15 g/dL (ref 12.0–15.0)
Immature Granulocytes: 0 %
Lymphocytes Relative: 42 %
Lymphs Abs: 4 10*3/uL (ref 0.7–4.0)
MCH: 31 pg (ref 26.0–34.0)
MCHC: 33.5 g/dL (ref 30.0–36.0)
MCV: 92.6 fL (ref 80.0–100.0)
Monocytes Absolute: 0.7 10*3/uL (ref 0.1–1.0)
Monocytes Relative: 7 %
Neutro Abs: 4.7 10*3/uL (ref 1.7–7.7)
Neutrophils Relative %: 49 %
Platelets: 280 10*3/uL (ref 150–400)
RBC: 4.84 MIL/uL (ref 3.87–5.11)
RDW: 13 % (ref 11.5–15.5)
WBC: 9.6 10*3/uL (ref 4.0–10.5)
nRBC: 0 % (ref 0.0–0.2)

## 2022-05-03 LAB — COMPREHENSIVE METABOLIC PANEL
ALT: 14 U/L (ref 0–44)
AST: 17 U/L (ref 15–41)
Albumin: 4.3 g/dL (ref 3.5–5.0)
Alkaline Phosphatase: 47 U/L (ref 38–126)
Anion gap: 6 (ref 5–15)
BUN: 10 mg/dL (ref 6–20)
CO2: 20 mmol/L — ABNORMAL LOW (ref 22–32)
Calcium: 9 mg/dL (ref 8.9–10.3)
Chloride: 110 mmol/L (ref 98–111)
Creatinine, Ser: 0.58 mg/dL (ref 0.44–1.00)
GFR, Estimated: >60 mL/min (ref >60)
Glucose, Bld: 98 mg/dL (ref 70–99)
Potassium: 3.9 mmol/L (ref 3.5–5.1)
Sodium: 136 mmol/L (ref 135–145)
Total Bilirubin: 0.6 mg/dL (ref 0.3–1.2)
Total Protein: 7.3 g/dL (ref 6.5–8.1)

## 2022-05-03 LAB — LIPASE, BLOOD: Lipase: 28 U/L (ref 11–51)

## 2022-05-03 MED ORDER — SODIUM CHLORIDE 0.9 % IV BOLUS
500.0000 mL | Freq: Once | INTRAVENOUS | Status: AC
  Administered 2022-05-03: 500 mL via INTRAVENOUS

## 2022-05-03 MED ORDER — HYDROMORPHONE HCL 1 MG/ML IJ SOLN
1.0000 mg | Freq: Once | INTRAMUSCULAR | Status: AC
  Administered 2022-05-03: 1 mg via INTRAVENOUS
  Filled 2022-05-03: qty 1

## 2022-05-03 MED ORDER — ONDANSETRON HCL 4 MG/2ML IJ SOLN
4.0000 mg | Freq: Once | INTRAMUSCULAR | Status: AC
  Administered 2022-05-03: 4 mg via INTRAVENOUS
  Filled 2022-05-03: qty 2

## 2022-05-03 NOTE — ED Notes (Signed)
Ultrasound at bedside

## 2022-05-03 NOTE — ED Provider Notes (Signed)
[facility] DEPT Provider Note   CSN: [id number] Arrival date & time: 05/03/22  0221     History  Chief Complaint  Patient presents with   Abdominal Pain   Nausea    [name] is a 46 y.o. female.  Patient presents to the emergency department for evaluation of upper abdominal pain.  Patient reports that the pain goes into her back.  She has nausea but no vomiting.  Patient was seen yesterday in the emergency department and had a cardiac work-up for chest pain that was negative.  Patient reports that her pain continues.      Home Medications Prior to Admission medications   Medication Sig Start Date End Date Taking? Authorizing Provider  famotidine (PEPCID) 20 MG tablet Take 20 mg by mouth daily as needed for heartburn or indigestion.   Yes [provider]  FIBER PO Take 2 tablets by mouth 3 (three) times daily.   Yes [provider]  ibuprofen (ADVIL,MOTRIN) 200 MG tablet Take 400 mg by mouth 2 (two) times daily as needed (pain).   Yes [provider]  MAGNESIUM PO Take 1 tablet by mouth at bedtime.   Yes [provider]      Allergies    Bee venom and Wasp venom    Review of Systems   Review of Systems  Physical Exam Updated Vital Signs BP 106/70   Pulse 62   Temp 98.7 F (37.1 C) (Oral)   Resp 18   LMP 04/23/2022 (Approximate)   SpO2 96%  Physical Exam Vitals and nursing note reviewed.  Constitutional:      General: She is not in acute distress.    Appearance: She is well-developed.  HENT:     Head: Normocephalic and atraumatic.     Mouth/Throat:     Mouth: Mucous membranes are moist.  Eyes:     General: Vision grossly intact. Gaze aligned appropriately.     Extraocular Movements: Extraocular movements intact.     Conjunctiva/sclera: Conjunctivae normal.  Cardiovascular:     Rate and Rhythm: Normal rate and regular rhythm.     Pulses: Normal pulses.     Heart sounds: Normal heart  sounds, S1 normal and S2 normal. No murmur heard.   No friction rub. No gallop.  Pulmonary:     Effort: Pulmonary effort is normal. No respiratory distress.     Breath sounds: Normal breath sounds.  Abdominal:     General: Bowel sounds are normal.     Palpations: Abdomen is soft.     Tenderness: There is abdominal tenderness in the right upper quadrant, epigastric area and left upper quadrant. There is no guarding or rebound.     Hernia: No hernia is present.  Musculoskeletal:        General: No swelling.     Cervical back: Full passive range of motion without pain, normal range of motion and neck supple. No spinous process tenderness or muscular tenderness. Normal range of motion.     Right lower leg: No edema.     Left lower leg: No edema.  Skin:    General: Skin is warm and dry.     Capillary Refill: Capillary refill takes less than 2 seconds.     Findings: No ecchymosis, erythema, rash or wound.  Neurological:     General: No focal deficit present.     Mental Status: She is alert and oriented to person, place, and time.     GCS: GCS  eye subscore is 4. GCS verbal subscore is 5. GCS motor subscore is 6.     Cranial Nerves: Cranial nerves 2-12 are intact.     Sensory: Sensation is intact.     Motor: Motor function is intact.     Coordination: Coordination is intact.  Psychiatric:        Attention and Perception: Attention normal.        Mood and Affect: Mood normal.        Speech: Speech normal.        Behavior: Behavior normal.    ED Results / Procedures / Treatments   Labs (all labs ordered are listed, but only abnormal results are displayed) Labs Reviewed  COMPREHENSIVE METABOLIC PANEL - Abnormal; Notable for the following components:      Result Value   CO2 20 (*)    All other components within normal limits  CBC WITH DIFFERENTIAL/PLATELET  LIPASE, BLOOD    EKG None  Radiology DG Chest 2 View  Result Date: 05/02/2022 CLINICAL DATA:  Chest pain. EXAM: CHEST - 2  VIEW COMPARISON:  Chest radiographs May 20, 2016. FINDINGS: No consolidation. No visible pleural effusions or pneumothorax. Cardiomediastinal silhouette is within normal limits. No acute osseous abnormality. Mild reverse S-shaped thoracolumbar curvature. IMPRESSION: No evidence of acute cardiopulmonary disease. Electronically Signed   By: Margaretha Sheffield M.D.   On: 05/02/2022 09:39   US ABDOMEN LIMITED RUQ (LIVER/GB)  Result Date: 05/03/2022 CLINICAL DATA:  Upper abdominal pain EXAM: ULTRASOUND ABDOMEN LIMITED RIGHT UPPER QUADRANT COMPARISON:  Abdominal CT 03/30/2015 FINDINGS: Gallbladder: Numerous shadowing gallstones obscuring the posterior wall. The anterior wall measures up to 4 mm in thickness. No focal tenderness or pericholecystic edema. Common bile duct: Diameter: 5 mm Liver: No focal lesion identified. Within normal limits in parenchymal echogenicity. Portal vein is patent on color Doppler imaging with normal direction of blood flow towards the liver. IMPRESSION: Numerous gallstones. No focal tenderness or pericholecystic edema typical of acute cholecystitis. Electronically Signed   By: Jorje Guild M.D.   On: 05/03/2022 05:18    Procedures Procedures    Medications Ordered in ED Medications  sodium chloride 0.9 % bolus 500 mL (500 mLs Intravenous New Bag/Given 05/03/22 0333)  HYDROmorphone (DILAUDID) injection 1 mg (1 mg Intravenous Given 05/03/22 0333)  ondansetron (ZOFRAN) injection 4 mg (4 mg Intravenous Given 05/03/22 2671)    ED Course/ Medical Decision Making/ A&P                           Medical Decision Making Problems Addressed: Biliary colic: acute illness or injury  Amount and/or Complexity of Data Reviewed External Data Reviewed: labs, radiology, ECG and notes. Labs: ordered. Decision-making details documented in ED Course. Radiology: ordered. Decision-making details documented in ED Course.  Risk Prescription drug management.   Patient presents with upper  abdominal pain.  Differential diagnosis considered includes cholecystitis, biliary colic, pancreatitis, gastritis, small bowel obstruction.  Records reviewed from yesterday's visit.  She did have a negative cardiac evaluation.  Examination is more consistent with abdominal etiology.  She has diffuse upper abdominal tenderness, no clear Murphy sign.  Patient given IV Dilaudid and had resolution of her pain.  Ultrasound shows multiple gallstones, no evidence of cholecystitis.  Patient will be discharged, outpatient surgical consultation, given return precautions.        Final Clinical Impression(s) / ED Diagnoses Final diagnoses:  Biliary colic    Rx / DC Orders ED Discharge  Orders     None         Orpah Greek, MD 05/03/22 [phone]

## 2022-05-03 NOTE — ED Triage Notes (Addendum)
Pt reports with epigastric pain that radiates to her back x 3 days. Pt also complains of nausea. Pt reports feeling like she is starving but is not. Pt states that her bowel movements are not regular and are painful and hard.

## 2024-06-06 ENCOUNTER — Encounter (HOSPITAL_COMMUNITY): Payer: Self-pay

## 2024-06-06 ENCOUNTER — Emergency Department (HOSPITAL_COMMUNITY)
Admission: EM | Admit: 2024-06-06 | Discharge: 2024-06-06 | Disposition: A | Discharge status: Home / Self Care | Payer: Self-pay | Attending: Emergency Medicine | Admitting: Emergency Medicine

## 2024-06-06 ENCOUNTER — Other Ambulatory Visit: Payer: Self-pay

## 2024-06-06 DIAGNOSIS — M25511 Pain in right shoulder: Secondary | ICD-10-CM | POA: Insufficient documentation

## 2024-06-06 MED ORDER — OXYCODONE-ACETAMINOPHEN 5-325 MG PO TABS
1.0000 | ORAL_TABLET | Freq: Four times a day (QID) | ORAL | 0 refills | Status: AC | PRN
Start: 2024-06-06 — End: ?

## 2024-06-06 MED ORDER — IBUPROFEN 600 MG PO TABS: 600.0000 mg | ORAL_TABLET | Freq: Four times a day (QID) | ORAL | 0 refills | Status: AC | PRN

## 2024-06-06 MED ORDER — METHOCARBAMOL 750 MG PO TABS: 750.0000 mg | ORAL_TABLET | Freq: Four times a day (QID) | ORAL | 0 refills | Status: AC

## 2024-06-06 NOTE — ED Triage Notes (Signed)
 Patient arrived reporting R shoulder pain x1 week, denies injury. States she has been taking advil with no relief, no other symptoms reported.

## 2024-06-06 NOTE — ED Provider Notes (Signed)
 [facility] EMERGENCY DEPARTMENT AT [facility] Provider Note   CSN: [id number] Arrival date & time: 06/06/24  9187     Patient presents with: Shoulder Pain   [name] is a 48 y.o. female.   Patient to ED with pain in the right shoulder developing over the last week. She denies fall or impact injury. 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 No history of similar symptoms. No fever, swelling. She reports she couldn't sleep last night because the pain has gotten worse.   The history is provided by the patient. No language interpreter was used.  Shoulder Pain      Prior to Admission medications   Medication Sig Start Date End Date Taking? Authorizing Provider  ibuprofen (ADVIL) 600 MG tablet Take 1 tablet (600 mg total) by mouth every 6 (six) hours as needed. 06/06/24  Yes Shruti Arrey, Margit, PA-C  methocarbamol  (ROBAXIN ) 750 MG tablet Take 1 tablet (750 mg total) by mouth 4 (four) times daily. 06/06/24  Yes Odell Margit, PA-C  oxyCODONE -acetaminophen  (PERCOCET/ROXICET) 5-325 MG tablet Take 1 tablet by mouth every 6 (six) hours as needed for severe pain (pain score 7-10). 06/06/24  Yes Jaiyon Wander, PA-C  famotidine (PEPCID) 20 MG tablet Take 20 mg by mouth daily as needed for heartburn or indigestion.    [provider]  FIBER PO Take 2 tablets by mouth 3 (three) times daily.    [provider]  MAGNESIUM  PO Take 1 tablet by mouth at bedtime.    [provider]    Allergies: Bee venom and Wasp venom    Review of Systems  Updated Vital Signs BP (!) 155/101 (BP Location: Left Arm)   Pulse 80   Temp 97.8 F (36.6 C) (Oral)   Resp 16   Ht 5' 4 (1.626 m)   Wt 63.5 kg   SpO2 100%   BMI 24.03 kg/m   Physical Exam Vitals and nursing note reviewed.  Constitutional:      General: She is not in acute distress.    Appearance: She is well-developed. She is not ill-appearing.   Pulmonary:     Effort: Pulmonary effort is normal.   Musculoskeletal:        General: Normal range of motion.     Cervical back: Normal range of motion.     Comments: Right shoulder has no swelling or bony deformity. No discoloration. Arm is most comfortable in adduction, internally rotated. Tender over deltoid, no induration or fluctuance to suggest infection. Distal pulses present.    Skin:    General: Skin is warm and dry.   Neurological:     Mental Status: She is alert and oriented to person, place, and time.     (all labs ordered are listed, but only abnormal results are displayed) Labs Reviewed - No data to display  EKG: None  Radiology: No results found.   Procedures   Medications Ordered in the ED - No data to display  Clinical Course as of 06/06/24 0929  Tue Jun 06, 2024  0928 Right shoulder pain progressive over one week without known injury. No infection. No bony deformity. No vascular compromise. Will treat supportively. Refer to ortho if no better in 2-3 days  [SU]    Clinical Course User Index [SU] Odell Margit, PA-C  Medical Decision Making       Final diagnoses:  Acute pain of right shoulder    ED Discharge Orders          Ordered    ibuprofen (ADVIL) 600 MG tablet  Every 6 hours PRN        06/06/24 0915    oxyCODONE -acetaminophen  (PERCOCET/ROXICET) 5-325 MG tablet  Every 6 hours PRN        06/06/24 0915    methocarbamol  (ROBAXIN ) 750 MG tablet  4 times daily        06/06/24 0915               Odell Balls, PA-C 06/06/24 9070    Tegeler, Lonni PARAS, MD 06/06/24 1513

## 2024-06-06 NOTE — Discharge Instructions (Signed)
 As we discussed, rest the shoulder over the next couple of days. Take the medications as prescribed. If your pain does not improve, please call Dr. Elbert with orthopedics to schedule a time to be seen for further evaluation.   Return to the ED with any new or worsening symptoms at any time.
